# Patient Record
Sex: Female | Born: 1960 | Race: White | Hispanic: No | Marital: Married | Smoking: Current some day smoker
Health system: Southern US, Community
[De-identification: ages and names within clinical notes are randomized; demographics above are authoritative.]

## PROBLEM LIST (undated history)

## (undated) DIAGNOSIS — I4891 Unspecified atrial fibrillation: Secondary | ICD-10-CM

## (undated) DIAGNOSIS — F419 Anxiety disorder, unspecified: Secondary | ICD-10-CM

## (undated) DIAGNOSIS — I428 Other cardiomyopathies: Secondary | ICD-10-CM

## (undated) DIAGNOSIS — E782 Mixed hyperlipidemia: Secondary | ICD-10-CM

## (undated) DIAGNOSIS — E7800 Pure hypercholesterolemia, unspecified: Secondary | ICD-10-CM

## (undated) DIAGNOSIS — J302 Other seasonal allergic rhinitis: Secondary | ICD-10-CM

## (undated) DIAGNOSIS — G4733 Obstructive sleep apnea (adult) (pediatric): Secondary | ICD-10-CM

## (undated) DIAGNOSIS — I10 Essential (primary) hypertension: Secondary | ICD-10-CM

## (undated) HISTORY — DX: Obstructive sleep apnea (adult) (pediatric): G47.33

## (undated) HISTORY — DX: Other cardiomyopathies: I42.8

## (undated) HISTORY — DX: Mixed hyperlipidemia: E78.2

## (undated) HISTORY — DX: Anxiety disorder, unspecified: F41.9

## (undated) HISTORY — DX: Essential (primary) hypertension: I10

## (undated) HISTORY — DX: Unspecified atrial fibrillation: I48.91

## (undated) HISTORY — PX: CHOLECYSTECTOMY: SHX55

---

## 2001-07-15 ENCOUNTER — Emergency Department (HOSPITAL_COMMUNITY): Admission: EM | Admit: 2001-07-15 | Discharge: 2001-07-16 | Payer: Self-pay | Admitting: Emergency Medicine

## 2001-07-16 ENCOUNTER — Ambulatory Visit (HOSPITAL_COMMUNITY): Admission: AD | Admit: 2001-07-16 | Discharge: 2001-07-16 | Payer: Self-pay | Admitting: Obstetrics and Gynecology

## 2001-07-16 ENCOUNTER — Encounter: Payer: Self-pay | Admitting: Obstetrics and Gynecology

## 2001-09-19 ENCOUNTER — Other Ambulatory Visit: Admission: RE | Admit: 2001-09-19 | Discharge: 2001-09-19 | Payer: Self-pay | Admitting: Obstetrics and Gynecology

## 2003-03-20 ENCOUNTER — Other Ambulatory Visit: Admission: RE | Admit: 2003-03-20 | Discharge: 2003-03-20 | Payer: Self-pay | Admitting: Obstetrics and Gynecology

## 2004-05-28 ENCOUNTER — Inpatient Hospital Stay (HOSPITAL_COMMUNITY): Admission: AD | Admit: 2004-05-28 | Discharge: 2004-05-28 | Payer: Self-pay | Admitting: Obstetrics and Gynecology

## 2011-08-10 ENCOUNTER — Emergency Department (HOSPITAL_COMMUNITY)
Admission: EM | Admit: 2011-08-10 | Discharge: 2011-08-10 | Disposition: A | Discharge status: Home / Self Care | Payer: Managed Care, Other (non HMO) | Attending: Emergency Medicine | Admitting: Emergency Medicine

## 2011-08-10 ENCOUNTER — Encounter: Payer: Self-pay | Admitting: Oncology

## 2011-08-10 ENCOUNTER — Emergency Department (HOSPITAL_COMMUNITY): Payer: Managed Care, Other (non HMO)

## 2011-08-10 DIAGNOSIS — R071 Chest pain on breathing: Secondary | ICD-10-CM | POA: Insufficient documentation

## 2011-08-10 DIAGNOSIS — R0781 Pleurodynia: Secondary | ICD-10-CM

## 2011-08-10 DIAGNOSIS — F17200 Nicotine dependence, unspecified, uncomplicated: Secondary | ICD-10-CM | POA: Insufficient documentation

## 2011-08-10 MED ORDER — OXYCODONE-ACETAMINOPHEN 5-325 MG PO TABS: 1.0000 | ORAL_TABLET | ORAL | Status: AC | PRN

## 2011-08-10 MED ORDER — KETOROLAC TROMETHAMINE 60 MG/2ML IM SOLN
60.0000 mg | Freq: Once | INTRAMUSCULAR | Status: AC
  Administered 2011-08-10: 60 mg via INTRAMUSCULAR
  Filled 2011-08-10: qty 2

## 2011-08-10 NOTE — Discharge Instructions (Signed)
Chest Pain (Nonspecific) It is often hard to give a specific diagnosis for the cause of chest pain. There is always a chance that your pain could be related to something serious, such as a heart attack or a blood clot in the lungs. You need to follow up with your caregiver for further evaluation. CAUSES   Heartburn.   Pneumonia or bronchitis.   Anxiety and stress.   Inflammation around your heart (pericarditis) or lung (pleuritis or pleurisy).   A blood clot in the lung.   A collapsed lung (pneumothorax). It can develop suddenly on its own (spontaneous pneumothorax) or from injury (trauma) to the chest.  The chest wall is composed of bones, muscles, and cartilage. Any of these can be the source of the pain.  The bones can be bruised by injury.   The muscles or cartilage can be strained by coughing or overwork.   The cartilage can be affected by inflammation and become sore (costochondritis).  DIAGNOSIS  Lab tests or other studies, such as X-rays, an EKG, stress testing, or cardiac imaging, may be needed to find the cause of your pain.  TREATMENT   Treatment depends on what may be causing your chest pain. Treatment may include:   Acid blockers for heartburn.   Anti-inflammatory medicine.   Pain medicine for inflammatory conditions.   Antibiotics if an infection is present.   You may be advised to change lifestyle habits. This includes stopping smoking and avoiding caffeine and chocolate.   You may be advised to keep your head raised (elevated) when sleeping. This reduces the chance of acid going backward from your stomach into your esophagus.   Most of the time, nonspecific chest pain will improve within 2 to 3 days with rest and mild pain medicine.  HOME CARE INSTRUCTIONS   If antibiotics were prescribed, take the full amount even if you start to feel better.   For the next few days, avoid physical activities that bring on chest pain. Continue physical activities as  directed.   Do not smoke cigarettes or drink alcohol until your symptoms are gone.   Only take over-the-counter or prescription medicine for pain, discomfort, or fever as directed by your caregiver.   Follow your caregiver's suggestions for further testing if your chest pain does not go away.   Keep any follow-up appointments you made. If you do not go to an appointment, you could develop lasting (chronic) problems with pain. If there is any problem keeping an appointment, you must call to reschedule.  SEEK MEDICAL CARE IF:   You think you are having problems from the medicine you are taking. Read your medicine instructions carefully.   Your chest pain does not go away, even after treatment.   You develop a rash with blisters on your chest.  SEEK IMMEDIATE MEDICAL CARE IF:   You have increased chest pain or pain that spreads to your arm, neck, jaw, back, or belly (abdomen).   You develop shortness of breath, an increasing cough, or you are coughing up blood.   You have severe back or abdominal pain, feel sick to your stomach (nauseous) or throw up (vomit).   You develop severe weakness, fainting, or chills.   You have an oral temperature above 102 F (38.9 C), not controlled by medicine.  THIS IS AN EMERGENCY. Do not wait to see if the pain will go away. Get medical help at once. Call your local emergency services (911 in [place] Do not drive yourself to   the hospital. MAKE SURE YOU:   Understand these instructions.   Will watch your condition.   Will get help right away if you are not doing well or get worse.  Document Released: 05/04/2005 Document Revised: 04/06/2011 Document Reviewed: 02/28/2008 [employer] Patient Information 2012 [employer]. 

## 2011-08-10 NOTE — ED Notes (Signed)
Pt reports worsening cough x 5 days - coughing episode this morning and felt a "pop" in her left rib cage.  In ER d/t pain.  Took 800mg  Ibuprofen @ 0300 this morning w/o relief of symptoms.

## 2011-08-10 NOTE — ED Provider Notes (Signed)
History     CSN: [id number]  Arrival date & time 08/10/11  4098   First MD Initiated Contact with Patient 08/10/11 [phone]      Chief Complaint  Patient presents with  . Rib pain     (left) rib    (Consider location/radiation/quality/duration/timing/severity/associated sxs/prior treatment) HPI Comments: Patient c/o pain to her left lateral ribs.  States she was at work this morning and sneezed, then felt a sharp pain to her ribs and also felt a "pop" .  Now has pain to same ara with movement, cough, deep breath.  She denies abd pain, fall, dyspnea, or sternal chest pain  Patient is a 51 y.o. female presenting with chest pain. The history is provided by the patient.  Chest Pain The chest pain began 1 - 2 hours ago. Chest pain occurs constantly. The chest pain is unchanged. The pain is associated with breathing, lifting and exertion (sneezing). The severity of the pain is moderate. The quality of the pain is described as aching and sharp. The pain does not radiate. Chest pain is worsened by certain positions, deep breathing and exertion. Pertinent negatives for primary symptoms include no fever, no syncope, no shortness of breath, no cough, no wheezing, no palpitations, no abdominal pain, no vomiting, no dizziness and no altered mental status.  Pertinent negatives for associated symptoms include no lower extremity edema, no near-syncope, no numbness, no orthopnea and no weakness. She tried nothing for the symptoms. Risk factors include smoking/tobacco exposure.     History reviewed. No pertinent past medical history.  Past Surgical History  Procedure Date  . Cholecystectomy     History reviewed. No pertinent family history.  History  Substance Use Topics  . Smoking status: Current Everyday Smoker -- 0.5 packs/day    Types: Cigarettes  . Smokeless tobacco: Not on file  . Alcohol Use: No    OB History    Grav Para Term Preterm Abortions TAB SAB Ect Mult Living                   Review of Systems  Constitutional: Negative for fever.  HENT: Negative for neck stiffness and ear discharge.   Respiratory: Negative for cough, chest tightness, shortness of breath and wheezing.   Cardiovascular: Positive for chest pain. Negative for palpitations, orthopnea, leg swelling, syncope and near-syncope.  Gastrointestinal: Negative for vomiting and abdominal pain.  Skin: Negative.   Neurological: Negative for dizziness, weakness, numbness and headaches.  Psychiatric/Behavioral: Negative for altered mental status.  All other systems reviewed and are negative.    Allergies  Review of patient's allergies indicates no known allergies.  Home Medications  No current outpatient prescriptions on file.  BP 132/100  Pulse 85  Temp(Src) 97.5 F (36.4 C) (Oral)  Resp 18  Ht 5\' 9"  (1.753 m)  Wt 235 lb (106.595 kg)  BMI 34.70 kg/m2  SpO2 98%  LMP 08/05/2011  Physical Exam  Nursing note and vitals reviewed. Constitutional: She is oriented to person, place, and time. She appears well-developed and well-nourished.       Uncomfortable appearing  HENT:  Head: Normocephalic and atraumatic.  Mouth/Throat: Oropharynx is clear and moist.  Eyes: EOM are normal. Pupils are equal, round, and reactive to light.  Neck: Normal range of motion. Neck supple. No spinous process tenderness and no muscular tenderness present.  Cardiovascular: Normal rate, regular rhythm, normal heart sounds and intact distal pulses.   No murmur heard. Pulmonary/Chest: Effort normal and breath sounds normal. No  respiratory distress. She has no wheezes. She has no rales. Chest wall is not dull to percussion. She exhibits tenderness. She exhibits no mass, no bony tenderness, no laceration, no crepitus, no edema, no deformity, no swelling and no retraction.    Abdominal: Soft. Bowel sounds are normal. She exhibits no distension. There is no tenderness. There is no rebound and no guarding.  Musculoskeletal: She  exhibits no edema.  Lymphadenopathy:    She has no cervical adenopathy.  Neurological: She is alert and oriented to person, place, and time.  Skin: Skin is warm and dry.    ED Course  Procedures (including critical care time)  Labs Reviewed - No data to display Dg Ribs Unilateral W/chest Left  08/10/2011  *RADIOLOGY REPORT*  Clinical Data: Left rib pain.  Pleuritic chest pain.  LEFT RIBS AND CHEST - 3+ VIEW  Comparison: None.  Findings: No definite left rib fractures are identified.  No other bone lesions are seen.  No evidence of pneumothorax or hemothorax. Both lungs are clear.  Heart size and mediastinal contours are within normal limits.  IMPRESSION: Negative.  Original Report Authenticated By: Danae Orleans, M.D.        MDM       Vitals are stable.  No hypoxia or tachypnea.  Localized ttp of the lateral left ribs.  Possible occult fx vs chest wall pain.  Clinical suspicion for PE is low.  Patient agrees to close follow-up with her PMD or to return here if sx's worsen   Mathias Bogacki L. [place], [place] 08/10/11 [phone]

## 2011-08-12 NOTE — ED Provider Notes (Signed)
Medical screening examination/treatment/procedure(s) were performed by non-physician practitioner and as supervising physician I was immediately available for consultation/collaboration.   Shelda Jakes, MD 08/12/11 [phone]

## 2011-09-05 ENCOUNTER — Other Ambulatory Visit (HOSPITAL_COMMUNITY): Payer: Self-pay | Admitting: Internal Medicine

## 2011-09-05 DIAGNOSIS — J069 Acute upper respiratory infection, unspecified: Secondary | ICD-10-CM

## 2011-09-05 DIAGNOSIS — R109 Unspecified abdominal pain: Secondary | ICD-10-CM

## 2011-09-07 ENCOUNTER — Ambulatory Visit (HOSPITAL_COMMUNITY)
Admission: RE | Admit: 2011-09-07 | Discharge: 2011-09-07 | Disposition: A | Discharge status: Home / Self Care | Payer: Managed Care, Other (non HMO) | Source: Ambulatory Visit | Attending: Internal Medicine | Admitting: Internal Medicine

## 2011-09-07 ENCOUNTER — Encounter (HOSPITAL_COMMUNITY): Payer: Self-pay

## 2011-09-07 DIAGNOSIS — R1012 Left upper quadrant pain: Secondary | ICD-10-CM | POA: Insufficient documentation

## 2011-09-07 DIAGNOSIS — R1011 Right upper quadrant pain: Secondary | ICD-10-CM | POA: Insufficient documentation

## 2011-09-07 DIAGNOSIS — R109 Unspecified abdominal pain: Secondary | ICD-10-CM

## 2011-09-07 DIAGNOSIS — J069 Acute upper respiratory infection, unspecified: Secondary | ICD-10-CM | POA: Insufficient documentation

## 2011-09-07 MED ORDER — IOHEXOL 300 MG/ML  SOLN
100.0000 mL | Freq: Once | INTRAMUSCULAR | Status: AC | PRN
  Administered 2011-09-07: 100 mL via INTRAVENOUS

## 2011-09-07 MED ORDER — IOHEXOL 300 MG/ML  SOLN
40.0000 mL | Freq: Once | INTRAMUSCULAR | Status: AC | PRN
  Administered 2011-09-07: 40 mL via ORAL

## 2011-09-09 ENCOUNTER — Encounter (INDEPENDENT_AMBULATORY_CARE_PROVIDER_SITE_OTHER): Payer: Self-pay | Admitting: *Deleted

## 2011-09-26 ENCOUNTER — Ambulatory Visit (INDEPENDENT_AMBULATORY_CARE_PROVIDER_SITE_OTHER): Payer: Managed Care, Other (non HMO) | Admitting: Internal Medicine

## 2011-09-29 ENCOUNTER — Encounter (INDEPENDENT_AMBULATORY_CARE_PROVIDER_SITE_OTHER): Payer: Self-pay | Admitting: *Deleted

## 2011-10-13 ENCOUNTER — Other Ambulatory Visit (HOSPITAL_COMMUNITY): Payer: Self-pay | Admitting: Family Medicine

## 2011-10-13 DIAGNOSIS — Z01419 Encounter for gynecological examination (general) (routine) without abnormal findings: Secondary | ICD-10-CM

## 2011-10-13 DIAGNOSIS — Z139 Encounter for screening, unspecified: Secondary | ICD-10-CM

## 2011-10-14 ENCOUNTER — Ambulatory Visit (HOSPITAL_COMMUNITY)
Admission: RE | Admit: 2011-10-14 | Discharge: 2011-10-14 | Disposition: A | Discharge status: Home / Self Care | Payer: Managed Care, Other (non HMO) | Source: Ambulatory Visit | Attending: Family Medicine | Admitting: Family Medicine

## 2011-10-14 DIAGNOSIS — Z1231 Encounter for screening mammogram for malignant neoplasm of breast: Secondary | ICD-10-CM | POA: Insufficient documentation

## 2011-10-14 DIAGNOSIS — Z01419 Encounter for gynecological examination (general) (routine) without abnormal findings: Secondary | ICD-10-CM

## 2011-10-14 DIAGNOSIS — Z139 Encounter for screening, unspecified: Secondary | ICD-10-CM

## 2013-11-14 ENCOUNTER — Other Ambulatory Visit (HOSPITAL_COMMUNITY): Payer: Self-pay | Admitting: Family Medicine

## 2013-11-14 DIAGNOSIS — Z0000 Encounter for general adult medical examination without abnormal findings: Secondary | ICD-10-CM

## 2013-11-18 ENCOUNTER — Ambulatory Visit (HOSPITAL_COMMUNITY)
Admission: RE | Admit: 2013-11-18 | Discharge: 2013-11-18 | Disposition: A | Discharge status: Home / Self Care | Payer: Managed Care, Other (non HMO) | Source: Ambulatory Visit | Attending: Family Medicine | Admitting: Family Medicine

## 2013-11-18 DIAGNOSIS — Z1231 Encounter for screening mammogram for malignant neoplasm of breast: Secondary | ICD-10-CM | POA: Insufficient documentation

## 2013-11-18 DIAGNOSIS — Z0000 Encounter for general adult medical examination without abnormal findings: Secondary | ICD-10-CM

## 2014-03-19 ENCOUNTER — Telehealth: Payer: Self-pay

## 2014-03-19 NOTE — Telephone Encounter (Signed)
Gastroenterology Pre-Procedure Review  Request Date: Requesting Physician: Fusco  PATIENT REVIEW QUESTIONS: The patient responded to the following health history questions as indicated:    1. Diabetes Melitis: NO 2. Joint replacements in the past 12 months: NO 3. Major health problems in the past 3 months: NO 4. Has an artificial valve or MVP: NO 5. Has a defibrillator: NO 6. Has been advised in past to take antibiotics in advance of a procedure like teeth cleaning: NO 7.Alcohol: NO 8. Family History:NO    MEDICATIONS & ALLERGIES:    Patient reports the following regarding taking any blood thinners:   Plavix? NO Aspirin? YES Coumadin? NO  Patient confirms/reports the following medications:  Current Outpatient Prescriptions  Medication Sig Dispense Refill  . dextromethorphan-guaiFENesin (TUSSIN DM) 10-100 MG/5ML liquid Take 30 mLs by mouth every 4 (four) hours as needed. Cough       . ibuprofen (ADVIL,MOTRIN) 800 MG tablet Take 800 mg by mouth every 8 (eight) hours as needed. Pain        No current facility-administered medications for this visit.    Patient confirms/reports the following allergies:  No Known Allergies  No orders of the defined types were placed in this encounter.    AUTHORIZATION INFORMATION Primary Insurance: [place],  [place] #: [id number],  Group #: [id number] Pre-Cert / Josem Kaufmann required: Pre-Cert / Auth #:   SCHEDULE INFORMATION: Procedure has been scheduled as follows:  Date: , Time:  Location:   This Gastroenterology Pre-Precedure Review Form is being routed to the following provider(s):

## 2014-03-21 NOTE — Telephone Encounter (Signed)
LMOM to call to schedule the colonoscopy.  

## 2014-03-24 ENCOUNTER — Encounter (HOSPITAL_COMMUNITY): Payer: Self-pay | Admitting: Pharmacy Technician

## 2014-03-24 ENCOUNTER — Other Ambulatory Visit: Payer: Self-pay

## 2014-03-24 DIAGNOSIS — Z1211 Encounter for screening for malignant neoplasm of colon: Secondary | ICD-10-CM

## 2014-03-24 NOTE — Telephone Encounter (Signed)
PT is scheduled for 04/07/2014 at 8:30 AM with Dr. Oneida Alar for colonoscopy.

## 2014-03-25 NOTE — Telephone Encounter (Signed)
PREPOPIK-DRINK WATER TO KEEP URINE LIGHT YELLOW.  PT SHOULD DROP OFF RX 3 DAYS PRIOR TO PROCEDURE.  

## 2014-03-26 MED ORDER — SOD PICOSULFATE-MAG OX-CIT ACD 10-3.5-12 MG-GM-GM PO PACK: 1.0000 | PACK | ORAL | Status: DC

## 2014-03-26 NOTE — Telephone Encounter (Signed)
Rx sent to the pharmacy and instructions mailed to pt.  

## 2014-04-03 ENCOUNTER — Telehealth: Payer: Self-pay

## 2014-04-03 NOTE — Telephone Encounter (Signed)
PA NOT REQUIRED FOR OUTPATIENT SCREENING COLONOSCOPY.

## 2014-04-07 ENCOUNTER — Encounter (HOSPITAL_COMMUNITY)
Admission: RE | Disposition: A | Discharge status: Home / Self Care | Payer: Self-pay | Source: Ambulatory Visit | Attending: Gastroenterology

## 2014-04-07 ENCOUNTER — Encounter (HOSPITAL_COMMUNITY): Payer: Self-pay | Admitting: *Deleted

## 2014-04-07 ENCOUNTER — Ambulatory Visit (HOSPITAL_COMMUNITY)
Admission: RE | Admit: 2014-04-07 | Discharge: 2014-04-07 | Disposition: A | Discharge status: Home / Self Care | Payer: Managed Care, Other (non HMO) | Source: Ambulatory Visit | Attending: Gastroenterology | Admitting: Gastroenterology

## 2014-04-07 DIAGNOSIS — K648 Other hemorrhoids: Secondary | ICD-10-CM | POA: Diagnosis not present

## 2014-04-07 DIAGNOSIS — Z7982 Long term (current) use of aspirin: Secondary | ICD-10-CM | POA: Diagnosis not present

## 2014-04-07 DIAGNOSIS — Z1211 Encounter for screening for malignant neoplasm of colon: Secondary | ICD-10-CM | POA: Insufficient documentation

## 2014-04-07 DIAGNOSIS — Q438 Other specified congenital malformations of intestine: Secondary | ICD-10-CM | POA: Insufficient documentation

## 2014-04-07 DIAGNOSIS — D126 Benign neoplasm of colon, unspecified: Secondary | ICD-10-CM | POA: Insufficient documentation

## 2014-04-07 DIAGNOSIS — Z79899 Other long term (current) drug therapy: Secondary | ICD-10-CM | POA: Insufficient documentation

## 2014-04-07 DIAGNOSIS — Z9089 Acquired absence of other organs: Secondary | ICD-10-CM | POA: Insufficient documentation

## 2014-04-07 HISTORY — DX: Other seasonal allergic rhinitis: J30.2

## 2014-04-07 HISTORY — PX: COLONOSCOPY: SHX5424

## 2014-04-07 HISTORY — DX: Pure hypercholesterolemia, unspecified: E78.00

## 2014-04-07 SURGERY — COLONOSCOPY
Anesthesia: Moderate Sedation

## 2014-04-07 MED ORDER — STERILE WATER FOR IRRIGATION IR SOLN
Status: DC | PRN
  Administered 2014-04-07: 09:00:00

## 2014-04-07 MED ORDER — SPOT INK MARKER SYRINGE KIT
PACK | SUBMUCOSAL | Status: DC | PRN
  Administered 2014-04-07: 1 mL via SUBMUCOSAL

## 2014-04-07 MED ORDER — SODIUM CHLORIDE 0.9 % IV SOLN
INTRAVENOUS | Status: DC
  Administered 2014-04-07: 08:00:00 via INTRAVENOUS

## 2014-04-07 MED ORDER — MEPERIDINE HCL 100 MG/ML IJ SOLN
INTRAMUSCULAR | Status: AC
  Filled 2014-04-07: qty 2

## 2014-04-07 MED ORDER — MIDAZOLAM HCL 5 MG/5ML IJ SOLN
INTRAMUSCULAR | Status: DC | PRN
  Administered 2014-04-07 (×3): 2 mg via INTRAVENOUS

## 2014-04-07 MED ORDER — MIDAZOLAM HCL 5 MG/5ML IJ SOLN
INTRAMUSCULAR | Status: AC
  Filled 2014-04-07: qty 10

## 2014-04-07 MED ORDER — MEPERIDINE HCL 100 MG/ML IJ SOLN
INTRAMUSCULAR | Status: DC | PRN
  Administered 2014-04-07 (×3): 25 mg via INTRAVENOUS

## 2014-04-07 NOTE — Discharge Instructions (Signed)
You had 2 polyps removed. You have internal hemorrhoids and diverticulosis IN YOUR RIGHT AND LEFT COLON.   FOLLOW A HIGH FIBER DIET. AVOID ITEMS THAT CAUSE BLOATING. SEE INFO BELOW.  YOUR BIOPSY RESULTS SHOULD BE BACK IN 14 DAYS.  Next colonoscopy in 1-3 years.   Colonoscopy Care After Read the instructions outlined below and refer to this sheet in the next week. These discharge instructions provide you with general information on caring for yourself after you leave the hospital. While your treatment has been planned according to the most current medical practices available, unavoidable complications occasionally occur. If you have any problems or questions after discharge, call DR. Prarthana Parlin, [phone].  ACTIVITY  You may resume your regular activity, but move at a slower pace for the next 24 hours.   Take frequent rest periods for the next 24 hours.   Walking will help get rid of the air and reduce the bloated feeling in your belly (abdomen).   No driving for 24 hours (because of the medicine (anesthesia) used during the test).   You may shower.   Do not sign any important legal documents or operate any machinery for 24 hours (because of the anesthesia used during the test).    NUTRITION  Drink plenty of fluids.   You may resume your normal diet as instructed by your doctor.   Begin with a light meal and progress to your normal diet. Heavy or fried foods are harder to digest and may make you feel sick to your stomach (nauseated).   Avoid alcoholic beverages for 24 hours or as instructed.    MEDICATIONS  You may resume your normal medications.   WHAT YOU CAN EXPECT TODAY  Some feelings of bloating in the abdomen.   Passage of more gas than usual.   Spotting of blood in your stool or on the toilet paper  .  IF YOU HAD POLYPS REMOVED DURING THE COLONOSCOPY:  Eat a soft diet IF YOU HAVE NAUSEA, BLOATING, ABDOMINAL PAIN, OR VOMITING.    FINDING OUT THE RESULTS  OF YOUR TEST Not all test results are available during your visit. DR. Oneida Alar WILL CALL YOU WITHIN 7 DAYS OF YOUR PROCEDUE WITH YOUR RESULTS. Do not assume everything is normal if you have not heard from DR. Mertie Haslem IN ONE WEEK, CALL HER OFFICE AT [phone].  SEEK IMMEDIATE MEDICAL ATTENTION AND CALL THE OFFICE: [phone] IF:  You have more than a spotting of blood in your stool.   Your belly is swollen (abdominal distention).   You are nauseated or vomiting.   You have a temperature over 101F.   You have abdominal pain or discomfort that is severe or gets worse throughout the day.  Polyps, Colon  A polyp is extra tissue that grows inside your body. Colon polyps grow in the large intestine. The large intestine, also called the colon, is part of your digestive system. It is a long, hollow tube at the end of your digestive tract where your body makes and stores stool. Most polyps are not dangerous. They are benign. This means they are not cancerous. But over time, some types of polyps can turn into cancer. Polyps that are smaller than a pea are usually not harmful. But larger polyps could someday become or may already be cancerous. To be safe, doctors remove all polyps and test them.   WHO GETS POLYPS? Anyone can get polyps, but certain people are more likely than others. You may have a greater chance of  getting polyps if:  You are over 50.   You have had polyps before.   Someone in your family has had polyps.   Someone in your family has had cancer of the large intestine.   Find out if someone in your family has had polyps. You may also be more likely to get polyps if you:   Eat a lot of fatty foods   Smoke   Drink alcohol   Do not exercise  Eat too much   TREATMENT  The caregiver will remove the polyp during sigmoidoscopy or colonoscopy.  PREVENTION There is not one sure way to prevent polyps. You might be able to lower your risk of getting them if you:  Eat more  fruits and vegetables and less fatty food.   Do not smoke.   Avoid alcohol.   Exercise every day.   Lose weight if you are overweight.   Eating more calcium and folate can also lower your risk of getting polyps. Some foods that are rich in calcium are milk, cheese, and broccoli. Some foods that are rich in folate are chickpeas, kidney beans, and spinach.   High-Fiber Diet A high-fiber diet changes your normal diet to include more whole grains, legumes, fruits, and vegetables. Changes in the diet involve replacing refined carbohydrates with unrefined foods. The calorie level of the diet is essentially unchanged. The Dietary Reference Intake (recommended amount) for adult males is 38 grams per day. For adult females, it is 25 grams per day. Pregnant and lactating women should consume 28 grams of fiber per day. Fiber is the intact part of a plant that is not broken down during digestion. Functional fiber is fiber that has been isolated from the plant to provide a beneficial effect in the body. PURPOSE  Increase stool bulk.   Ease and regulate bowel movements.   Lower cholesterol.  INDICATIONS THAT YOU NEED MORE FIBER  Constipation and hemorrhoids.   Uncomplicated diverticulosis (intestine condition) and irritable bowel syndrome.   Weight management.   As a protective measure against hardening of the arteries (atherosclerosis), diabetes, and cancer.   GUIDELINES FOR INCREASING FIBER IN THE DIET  Start adding fiber to the diet slowly. A gradual increase of about 5 more grams (2 slices of whole-wheat bread, 2 servings of most fruits or vegetables, or 1 bowl of high-fiber cereal) per day is best. Too rapid an increase in fiber may result in constipation, flatulence, and bloating.   Drink enough water and fluids to keep your urine clear or pale yellow. Water, juice, or caffeine-free drinks are recommended. Not drinking enough fluid may cause constipation.   Eat a variety of high-fiber  foods rather than one type of fiber.   Try to increase your intake of fiber through using high-fiber foods rather than fiber pills or supplements that contain small amounts of fiber.   The goal is to change the types of food eaten. Do not supplement your present diet with high-fiber foods, but replace foods in your present diet.  INCLUDE A VARIETY OF FIBER SOURCES  Replace refined and processed grains with whole grains, canned fruits with fresh fruits, and incorporate other fiber sources. White rice, white breads, and most bakery goods contain little or no fiber.   Brown whole-grain rice, buckwheat oats, and many fruits and vegetables are all good sources of fiber. These include: broccoli, Brussels sprouts, cabbage, cauliflower, beets, sweet potatoes, white potatoes (skin on), carrots, tomatoes, eggplant, squash, berries, fresh fruits, and dried fruits.  Cereals appear to be the richest source of fiber. Cereal fiber is found in whole grains and bran. Bran is the fiber-rich outer coat of cereal grain, which is largely removed in refining. In whole-grain cereals, the bran remains. In breakfast cereals, the largest amount of fiber is found in those with "bran" in their names. The fiber content is sometimes indicated on the label.   You may need to include additional fruits and vegetables each day.   In baking, for 1 cup white flour, you may use the following substitutions:   1 cup whole-wheat flour minus 2 tablespoons.   1/2 cup white flour plus 1/2 cup whole-wheat flour.   Diverticulosis Diverticulosis is a common condition that develops when small pouches (diverticula) form in the wall of the colon. The risk of diverticulosis increases with age. It happens more often in people who eat a low-fiber diet. Most individuals with diverticulosis have no symptoms. Those individuals with symptoms usually experience belly (abdominal) pain, constipation, or loose stools (diarrhea).  HOME CARE  INSTRUCTIONS  Increase the amount of fiber in your diet as directed by your caregiver or dietician. This may reduce symptoms of diverticulosis.   Drink at least 6 to 8 glasses of water each day to prevent constipation.   Try not to strain when you have a bowel movement.   Avoiding nuts and seeds to prevent complications is still an uncertain benefit.       FOODS HAVING HIGH FIBER CONTENT INCLUDE:  Fruits. Apple, peach, pear, tangerine, raisins, prunes.   Vegetables. Brussels sprouts, asparagus, broccoli, cabbage, carrot, cauliflower, romaine lettuce, spinach, summer squash, tomato, winter squash, zucchini.   Starchy Vegetables. Baked beans, kidney beans, lima beans, split peas, lentils, potatoes (with skin).   Grains. Whole wheat bread, brown rice, bran flake cereal, plain oatmeal, white rice, shredded wheat, bran muffins.    SEEK IMMEDIATE MEDICAL CARE IF:  You develop increasing pain or severe bloating.   You have an oral temperature above 101F.   You develop vomiting or bowel movements that are bloody or black.   Hemorrhoids Hemorrhoids are dilated (enlarged) veins around the rectum. Sometimes clots will form in the veins. This makes them swollen and painful. These are called thrombosed hemorrhoids. Causes of hemorrhoids include:  Constipation.   Straining to have a bowel movement.   HEAVY LIFTING HOME CARE INSTRUCTIONS  Eat a well balanced diet and drink 6 to 8 glasses of water every day to avoid constipation. You may also use a bulk laxative.   Avoid straining to have bowel movements.   Keep anal area dry and clean.   Do not use a donut shaped pillow or sit on the toilet for long periods. This increases blood pooling and pain.   Move your bowels when your body has the urge; this will require less straining and will decrease pain and pressure.

## 2014-04-07 NOTE — Op Note (Signed)
[facility] [address] [place], [address]   COLONOSCOPY PROCEDURE REPORT  PATIENT: Jade, Bradley  MR#: [id number] BIRTHDATE: 1961-05-17 , 53  yrs. old GENDER: Female ENDOSCOPIST: Barney Drain, MD REFERRED JF:HLKT Hilma Favors, M.D. PROCEDURE DATE:  04/07/2014 PROCEDURE:   Colonoscopy with snare polypectomy and Submucosal injection, any substance INDICATIONS:Average risk patient for colon cancer. MEDICATIONS: Demerol 75 mg IV and Versed 6 mg IV  DESCRIPTION OF PROCEDURE:    Physical exam was performed.  Informed consent was obtained from the patient after explaining the benefits, risks, and alternatives to procedure.  The patient was connected to monitor and placed in left lateral position. Continuous oxygen was provided by nasal cannula and IV medicine administered through an indwelling cannula.  After administration of sedation and rectal exam, the patients rectum was intubated and the EC-3890Li ([id number]) and EC-3890Li ([id number])  colonoscope was advanced under direct visualization to the ileum.  The scope was removed slowly by carefully examining the color, texture, anatomy, and integrity mucosa on the way out.  The patient was recovered in endoscopy and discharged home in satisfactory condition.    COLON FINDINGS: There was moderate diverticulosis noted throughout the entire examined colon with associated muscular hypertrophy and tortuosity.  , Two sessile polyps measuring 6-8 mm in size were found at the hepatic flexure and in the sigmoid colon.  A polypectomy was performed using snare cautery. 1 CC SPOT INJECTED AT HEPATIC FLEXURE , The left colon is redundant.  Manual abdominal counter-pressure was used to reach the cecum, and Moderate sized internal hemorrhoids were found.  PREP QUALITY: good.  CECAL W/D TIME: 28 minutes     COMPLICATIONS: None  ENDOSCOPIC IMPRESSION: 1.   Moderate diverticulosis throughout the entire examined colon 2.   Two COLON  polyps REMOVED 3.   The LEFT colon IS redundant 4.   Moderate sized internal hemorrhoids   RECOMMENDATIONS: FOLLOW A HIGH FIBER DIET.  AVOID ITEMS THAT CAUSE BLOATING. BIOPSY RESULTS SHOULD BE BACK IN 14 DAYS.  Next colonoscopy WITH AN OVERTUBEin 1 year if advanced, 3 years if serrated, and 5 years if simple adenomas.       _______________________________ Lorrin MaisBarney Drain, MD 04/07/2014 4:03 PM

## 2014-04-07 NOTE — H&P (Signed)
  Primary Care Physician:  Purvis Kilts, MD Primary Gastroenterologist:  Dr. Oneida Alar  Pre-Procedure History & Physical: HPI:  Jade Bradley is a 53 y.o. female here for [place].  Past Medical History  Diagnosis Date  . Seasonal allergies   . Hypercholesteremia     Past Surgical History  Procedure Laterality Date  . Cholecystectomy      Prior to Admission medications   Medication Sig Start Date End Date Taking? Authorizing Provider  amLODipine (NORVASC) 5 MG tablet Take 5 mg by mouth daily.   Yes Historical Provider, MD  aspirin 325 MG tablet Take 325 mg by mouth daily.   Yes Historical Provider, MD  Omega-3 Fatty Acids (FISH OIL) 1000 MG CAPS Take 1,000 mg by mouth 2 (two) times daily.   Yes Historical Provider, MD  pseudoephedrine (SUDAFED) 120 MG 12 hr tablet Take 120 mg by mouth every 12 (twelve) hours as needed for congestion.   Yes Historical Provider, MD  Sod Picosulfate-Mag Ox-Cit Acd 10-3.5-12 MG-GM-GM PACK Take 1 Container by mouth as directed. 03/26/14  Yes Danie Binder, MD  dextromethorphan-guaiFENesin (TUSSIN DM) 10-100 MG/5ML liquid Take 30 mLs by mouth every 4 (four) hours as needed. Cough     Historical Provider, MD    Allergies as of 03/24/2014  . (No Known Allergies)    Family History  Problem Relation Age of Onset  . Colon cancer Neg Hx     History   Social History  . Marital Status: Divorced    Spouse Name: N/A    Number of Children: N/A  . Years of Education: N/A   Occupational History  . Not on file.   Social History Main Topics  . Smoking status: Current Every Day Smoker -- 0.50 packs/day for 48 years    Types: Cigarettes  . Smokeless tobacco: Not on file  . Alcohol Use: No  . Drug Use: No  . Sexual Activity: Not on file   Other Topics Concern  . Not on file   Social History Narrative  . No narrative on file    Review of Systems: See HPI, otherwise negative ROS   Physical Exam: BP 137/78  Pulse 70   Temp(Src) 97.7 F (36.5 C) (Oral)  Resp 14  Ht 5\' 8"  (1.727 m)  Wt 220 lb (99.791 kg)  BMI 33.46 kg/m2  SpO2 100%  LMP 08/11/2011 General:   Alert,  pleasant and cooperative in NAD Head:  Normocephalic and atraumatic. Neck:  Supple; Lungs:  Clear throughout to auscultation.    Heart:  Regular rate and rhythm. Abdomen:  Soft, nontender and nondistended. Normal bowel sounds, without guarding, and without rebound.   Neurologic:  Alert and  oriented x4;  grossly normal neurologically.  Impression/Plan:     SCREENING  Plan:  1. TCS TODAY

## 2014-04-17 ENCOUNTER — Telehealth: Payer: Self-pay | Admitting: Gastroenterology

## 2014-04-17 NOTE — Telephone Encounter (Signed)
Reminder in epic °

## 2014-04-17 NOTE — Telephone Encounter (Signed)
Called and informed pt.  

## 2014-04-17 NOTE — Telephone Encounter (Signed)
Please call pt. She had HYPERPLASTIC POLYPS removed.  FOLLOW A HIGH FIBER DIET. AVOID ITEMS THAT CAUSE BLOATING.  Next colonoscopy in 10 years. 

## 2015-06-16 ENCOUNTER — Other Ambulatory Visit (HOSPITAL_COMMUNITY): Payer: Self-pay | Admitting: Family Medicine

## 2015-06-16 DIAGNOSIS — Z1231 Encounter for screening mammogram for malignant neoplasm of breast: Secondary | ICD-10-CM

## 2015-06-22 ENCOUNTER — Ambulatory Visit (HOSPITAL_COMMUNITY)
Admission: RE | Admit: 2015-06-22 | Discharge: 2015-06-22 | Disposition: A | Discharge status: Home / Self Care | Payer: Managed Care, Other (non HMO) | Source: Ambulatory Visit | Attending: Family Medicine | Admitting: Family Medicine

## 2015-06-22 DIAGNOSIS — Z1231 Encounter for screening mammogram for malignant neoplasm of breast: Secondary | ICD-10-CM | POA: Insufficient documentation

## 2016-05-24 ENCOUNTER — Other Ambulatory Visit (HOSPITAL_COMMUNITY): Payer: Self-pay | Admitting: Family Medicine

## 2016-05-24 DIAGNOSIS — Z1231 Encounter for screening mammogram for malignant neoplasm of breast: Secondary | ICD-10-CM

## 2016-06-24 ENCOUNTER — Ambulatory Visit (HOSPITAL_COMMUNITY)
Admission: RE | Admit: 2016-06-24 | Discharge: 2016-06-24 | Disposition: A | Discharge status: Home / Self Care | Payer: Managed Care, Other (non HMO) | Source: Ambulatory Visit | Attending: Family Medicine | Admitting: Family Medicine

## 2016-06-24 DIAGNOSIS — Z1231 Encounter for screening mammogram for malignant neoplasm of breast: Secondary | ICD-10-CM | POA: Diagnosis present

## 2019-09-03 ENCOUNTER — Other Ambulatory Visit (HOSPITAL_COMMUNITY): Payer: Self-pay | Admitting: Family Medicine

## 2019-09-03 DIAGNOSIS — Z1231 Encounter for screening mammogram for malignant neoplasm of breast: Secondary | ICD-10-CM

## 2019-09-27 ENCOUNTER — Ambulatory Visit (HOSPITAL_COMMUNITY): Payer: Managed Care, Other (non HMO)

## 2019-10-04 ENCOUNTER — Ambulatory Visit (HOSPITAL_COMMUNITY)
Admission: RE | Admit: 2019-10-04 | Discharge: 2019-10-04 | Disposition: A | Discharge status: Home / Self Care | Payer: Managed Care, Other (non HMO) | Source: Ambulatory Visit | Attending: Family Medicine | Admitting: Family Medicine

## 2019-10-04 ENCOUNTER — Other Ambulatory Visit: Payer: Self-pay

## 2019-10-04 DIAGNOSIS — Z1231 Encounter for screening mammogram for malignant neoplasm of breast: Secondary | ICD-10-CM | POA: Diagnosis present

## 2019-10-30 ENCOUNTER — Encounter: Payer: Self-pay | Admitting: *Deleted

## 2019-10-31 ENCOUNTER — Ambulatory Visit (INDEPENDENT_AMBULATORY_CARE_PROVIDER_SITE_OTHER): Payer: Managed Care, Other (non HMO) | Admitting: Cardiovascular Disease

## 2019-10-31 ENCOUNTER — Telehealth: Payer: Self-pay | Admitting: *Deleted

## 2019-10-31 ENCOUNTER — Encounter: Payer: Self-pay | Admitting: Cardiovascular Disease

## 2019-10-31 ENCOUNTER — Other Ambulatory Visit: Payer: Self-pay

## 2019-10-31 VITALS — BP 120/86 | HR 104 | Ht 67.0 in | Wt 218.8 lb

## 2019-10-31 DIAGNOSIS — I10 Essential (primary) hypertension: Secondary | ICD-10-CM

## 2019-10-31 DIAGNOSIS — G4730 Sleep apnea, unspecified: Secondary | ICD-10-CM

## 2019-10-31 DIAGNOSIS — I4891 Unspecified atrial fibrillation: Secondary | ICD-10-CM

## 2019-10-31 MED ORDER — RIVAROXABAN 20 MG PO TABS: 20.0000 mg | ORAL_TABLET | Freq: Every day | ORAL | 6 refills | Status: DC

## 2019-10-31 MED ORDER — METOPROLOL SUCCINATE ER 50 MG PO TB24: 50.0000 mg | ORAL_TABLET | Freq: Every day | ORAL | 6 refills | Status: DC

## 2019-10-31 NOTE — Telephone Encounter (Signed)
Fax received from [facility] not covered on patient's insurance.    Eliquis is covered - would this medication be appropriate for her ?

## 2019-10-31 NOTE — Progress Notes (Signed)
CARDIOLOGY CONSULT NOTE  Patient ID: KEARIE PROPER MRN: [id number] DOB/AGE: Apr 25, 1961 59 y.o.  Admit date: (Not on file) Primary Physician: Sharilyn Sites, MD  Reason for Consultation: New onset atrial fibrillation  HPI: Jade Bradley is a 59 y.o. female who is being seen today for the evaluation of new onset atrial fibrillation at the request of Jake Samples, [place]*.   I personally reviewed the ECG performed today which shows rapid atrial fibrillation with PVCs, heart rate 111 bpm.  I reviewed PCP notes.  She was found to be tachycardic on physical exam with an irregular rhythm on 10/10/2019.  An ECG was reportedly obtained which is not available to me at present.  She appeared to be in rapid atrial fibrillation.  She was started on Toprol XL and Xarelto.  She is here with her husband.  They were married in January 2020.  She had been experiencing palpitations for at least a month prior to presenting to her PCP.  She has had episodic chest pressure both at rest and occasion with exertion.  She has leg swelling when she has been on her feet for long periods of time.  She has occasional dizziness but denies syncope.  She denies bleeding issues since being on Xarelto.  She stopped taking aspirin when she started Xarelto.  She has periodic headaches.  Her husband confirms that she snores a lot and the patient said her son can hear her snoring from a different room as well.  There have been no witnessed apneic episodes.  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  Her husband says that this has created a lot of stress for the patient.  He said she minimizes it at times.   No Known Allergies  Current Outpatient Medications  Medication Sig Dispense Refill  . amLODipine-valsartan (EXFORGE) 10-160 MG tablet Take 1 tablet by mouth daily.    Marland Kitchen aspirin EC 81 MG tablet Take 81 mg by mouth daily.    . metoprolol  succinate (TOPROL-XL) 25 MG 24 hr tablet Take 25 mg by mouth daily.    . pravastatin (PRAVACHOL) 20 MG tablet Take 20 mg by mouth daily.    . rivaroxaban (XARELTO) 20 MG TABS tablet Take 20 mg by mouth daily with supper.     No current facility-administered medications for this visit.    Past Medical History:  Diagnosis Date  . Hypercholesteremia   . Seasonal allergies     Past Surgical History:  Procedure Laterality Date  . CHOLECYSTECTOMY    . COLONOSCOPY N/A 04/07/2014   Procedure: COLONOSCOPY;  Surgeon: Danie Binder, MD;  [facility];  Service: Endoscopy;  Laterality: N/A;  8:30 AM    Social History   Socioeconomic History  . Marital status: Divorced    Spouse name: Not on file  . Number of children: Not on file  . Years of education: Not on file  . Highest education level: Not on file  Occupational History  . Not on file  Tobacco Use  . Smoking status: Current Every Day Smoker    Packs/day: 0.50    Years: 48.00    Pack years: 24.00    Types: Cigarettes    Start date: 03/17/1975  . Smokeless tobacco: Never Used  Substance and Sexual Activity  . Alcohol use: No  . Drug use: No  . Sexual activity: Not on file  Other Topics  Concern  . Not on file  Social History Narrative  . Not on file   Social Determinants of Health   Financial Resource Strain:   . Difficulty of Paying Living Expenses:   Food Insecurity:   . Worried About Charity fundraiser in the Last Year:   . Arboriculturist in the Last Year:   Transportation Needs:   . Film/video editor (Medical):   Marland Kitchen Lack of Transportation (Non-Medical):   Physical Activity:   . Days of Exercise per Week:   . Minutes of Exercise per Session:   Stress:   . Feeling of Stress :   Social Connections:   . Frequency of Communication with Friends and Family:   . Frequency of Social Gatherings with Friends and Family:   . Attends Religious Services:   . Active Member of Clubs or Organizations:   .  Attends Archivist Meetings:   Marland Kitchen Marital Status:   Intimate Partner Violence:   . Fear of Current or Ex-Partner:   . Emotionally Abused:   Marland Kitchen Physically Abused:   . Sexually Abused:      No family history of premature CAD in 1st degree relatives.  No outpatient medications have been marked as taking for the 10/31/19 encounter (Office Visit) with Herminio Commons, MD.      Review of systems complete and found to be negative unless listed above in HPI   [facility], LPN was present throughout the entirety of the encounter.  Physical exam Blood pressure 120/86, pulse (!) 104, height 5\' 7"  (1.702 m), weight 218 lb 12.8 oz (99.2 kg), last menstrual period 08/11/2011, SpO2 98 %. General: NAD Neck: No JVD, no thyromegaly or thyroid nodule.  Lungs: Clear to auscultation bilaterally with normal respiratory effort. CV: Nondisplaced PMI.  Tachycardic, irregular rhythm, normal S1/S2, no S3, no murmur.  No peripheral edema.  No carotid bruit.    Abdomen: Soft, nontender, no distention.  Skin: Intact without lesions or rashes.  Neurologic: Alert and oriented x 3.  Psych: Normal affect. Extremities: No clubbing or cyanosis.  HEENT: Normal.   ECG: Most recent ECG reviewed.   Labs: No results found for: K, BUN, CREATININE, ALT, TSH, HGB   Lipids: No results found for: LDLCALC, LDLDIRECT, CHOL, TRIG, HDL      ASSESSMENT AND PLAN:   1.  New onset rapid atrial fibrillation: Heart rate is suboptimally controlled on Toprol-XL 25 mg daily.  She is periodically symptomatic with palpitations.  I will increase Toprol-XL to 50 mg daily.  I will obtain an echocardiogram to evaluate cardiac structure, function, and left atrial size in particular.  If she remains symptomatic in spite of adequate heart rate control, I will proceed with direct-current cardioversion.  We discussed this in great detail.  She is anticoagulated with Xarelto and has not missed any doses.  2.  Hypertension:  Blood pressure is controlled on present therapy.  This will need further monitoring given the increase in dosage of Toprol-XL.  3.  Sleep disordered breathing: She is obese and has hypertension and new onset atrial fibrillation.  She has a long history of snoring and has periodic headaches.  She is at high risk for obstructive sleep apnea.  I will make a referral for a sleep study.    Disposition: Follow up in 3 months  Signed: Kate Sable, M.D., F.A.C.C.  10/31/2019, 10:41 AM

## 2019-10-31 NOTE — Patient Instructions (Addendum)
Medication Instructions:   Increase Toprol XL to 50mg  daily.    Continue all other medications.    Labwork:   Testing/Procedures:  Your physician has requested that you have an echocardiogram. Echocardiography is a painless test that uses sound waves to create images of your heart. It provides your doctor with information about the size and shape of your heart and how well your heart's chambers and valves are working. This procedure takes approximately one hour. There are no restrictions for this procedure.  Office will contact with results via phone or letter.    Follow-Up: 3 months - office   Any Other Special Instructions Will Be Listed Below (If Applicable). You have been referred to:  [facility]  If you need a refill on your cardiac medications before your next appointment, please call your pharmacy.

## 2019-10-31 NOTE — Telephone Encounter (Signed)
Yes, can start 5 mg bid.

## 2019-11-01 ENCOUNTER — Encounter: Payer: Self-pay | Admitting: *Deleted

## 2019-11-01 MED ORDER — APIXABAN 5 MG PO TABS: 5.0000 mg | ORAL_TABLET | Freq: Two times a day (BID) | ORAL | 6 refills | Status: DC

## 2019-11-01 NOTE — Telephone Encounter (Signed)
Patient notified and verbalized understanding.  Will send new prescription to [facility] now.

## 2019-11-04 ENCOUNTER — Telehealth: Payer: Self-pay | Admitting: Cardiovascular Disease

## 2019-11-04 MED ORDER — RIVAROXABAN 20 MG PO TABS: 20.0000 mg | ORAL_TABLET | Freq: Every day | ORAL | 6 refills | Status: DC

## 2019-11-04 MED ORDER — RIVAROXABAN 20 MG PO TABS: 20.0000 mg | ORAL_TABLET | Freq: Every day | ORAL | 0 refills | Status: DC

## 2019-11-04 NOTE — Telephone Encounter (Signed)
Received notification from [facility] on 11/01/19 that eliquis is not covered but xarelto is.  Patient advised to come by office for xarelto samples and voucher to get fist 30 days. Also given voucher for $10/mth copay card.

## 2019-11-04 NOTE — Telephone Encounter (Signed)
Reports being completely out of xarelto 20 mg. Took last dose yesterday. Reports she has not picked up eliquis from the pharmacy because she is waiting for her insurance to approve it. Need 30 day free voucher for anticoagulant.

## 2019-11-04 NOTE — Telephone Encounter (Signed)
Ask for Jade Bradley this is her work number.  She is out of Xarelto

## 2019-11-08 ENCOUNTER — Telehealth: Payer: Self-pay | Admitting: *Deleted

## 2019-11-08 NOTE — Telephone Encounter (Signed)
Received prior authorization approval from Cover My Meds from [place] approved on 11/07/2019 to 11/06/2020.   Case ID [id number]

## 2019-11-19 ENCOUNTER — Encounter: Payer: Self-pay | Admitting: Neurology

## 2019-11-19 ENCOUNTER — Ambulatory Visit (INDEPENDENT_AMBULATORY_CARE_PROVIDER_SITE_OTHER): Payer: Managed Care, Other (non HMO) | Admitting: Neurology

## 2019-11-19 ENCOUNTER — Other Ambulatory Visit: Payer: Self-pay

## 2019-11-19 VITALS — BP 134/85 | HR 82 | Temp 97.0°F | Ht 67.0 in | Wt 217.5 lb

## 2019-11-19 DIAGNOSIS — F17200 Nicotine dependence, unspecified, uncomplicated: Secondary | ICD-10-CM

## 2019-11-19 DIAGNOSIS — R002 Palpitations: Secondary | ICD-10-CM | POA: Diagnosis not present

## 2019-11-19 DIAGNOSIS — R0683 Snoring: Secondary | ICD-10-CM | POA: Diagnosis not present

## 2019-11-19 DIAGNOSIS — I480 Paroxysmal atrial fibrillation: Secondary | ICD-10-CM | POA: Diagnosis not present

## 2019-11-19 NOTE — Progress Notes (Signed)
SLEEP MEDICINE CLINIC    Provider:  Larey Seat, MD  Primary Care Physician:  Sharilyn Sites, [place] [id number]     Referring Provider: Herminio Commons, [place],  [place] [address]          Chief Complaint according to patient   Patient presents with:    . New Patient (Initial Visit)            HISTORY OF PRESENT ILLNESS:  Jade Bradley is a 59  year old Caucasian female patient seen on 11/19/2019. Chief concern according to patient :  I have atrial fibrillation and Dr Bronson Ing wants me to have a sleep study, I smoke since age 35.    I have the pleasure of seeing Jade RHEAULT a right -handed  Caucasian female with a past medical history of Anxiety, Atrial fibrillation ([place]), Hypercholesteremia, and Seasonal allergies. Obesity, tobacco use,    Sleep relevant medical history:  None.     Family medical /sleep history:No other family member on CPAP with OSA, insomnia, no sleep walkers.    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  The patient currently works early shifts- since 2004.  Pets are not present. Tobacco use: since age 52. Now at 2 packs a week. ETOH use none , Caffeine intake in form of Coffee( 3-4 day) Soda( 1/ week) Tea ( seldomly ) no energy drinks. Regular exercise in form of:" on my feet all day"      Sleep habits are as follows: The patient's dinner time is between 3.30 PM. The patient goes to bed at 8.30 PM and continues to sleep for 6 hours, wakes for 1 bathroom break. Feet hurt at night- burning.  The preferred sleep position is prone , with the support of 2 pillows, flat bed.  Dreams are reportedly infrequently.  2.45 AM is the usual rise time. The patient wakes up with 2 alarms.  She reports not feeling refreshed or restored in AM, with  residual fatigue. Naps are taken rarely.    Review of Systems: Out of a complete 14 system review, the patient complains of only the following symptoms, and all other reviewed systems are negative.:  Fatigue, sleepiness, snoring- per husband and son. Son sleeps in another part of the house and hears her snoring.    How likely are you to doze in the following situations: 0 = not likely, 1 = slight chance, 2 = moderate chance, 3 = high chance   Sitting and Reading? Watching Television? Sitting inactive in a public place (theater or meeting)? As a passenger in a car for an hour without a break? Lying down in the afternoon when circumstances permit? Sitting and talking to someone? Sitting quietly after lunch without alcohol? In a car, while stopped for a few minutes in traffic?   Total = 4/ 24 points   FSS endorsed at 35/ 63 points.   Social History   Socioeconomic History  . Marital status: Divorced    Spouse name: Not on file  . Number of children: 3  . Years of education: 10th grade  . Highest education level: Not on file  Occupational History  . Occupation: Animal nutritionist  Tobacco Use  . Smoking status: Current Every Day Smoker  Years: 48.00    Types: Cigarettes    Start date: 03/17/1975  . Smokeless tobacco: Never Used  . Tobacco comment: 2 packs per week  Substance and Sexual Activity  . Alcohol use: No  . Drug use: Never  . Sexual activity: Not on file  Other Topics Concern  . Not on file  Social History Narrative   Lives at home with husband.   Right-handed.   Three cups caffeine per day.   Social Determinants of Health   Financial Resource Strain:   . Difficulty of Paying Living Expenses:   Food Insecurity:   . Worried About Charity fundraiser in the Last Year:   . Arboriculturist in the Last Year:   Transportation Needs:   . Film/video editor (Medical):   Marland Kitchen Lack of Transportation (Non-Medical):   Physical Activity:   . Days of Exercise per  Week:   . Minutes of Exercise per Session:   Stress:   . Feeling of Stress :   Social Connections:   . Frequency of Communication with Friends and Family:   . Frequency of Social Gatherings with Friends and Family:   . Attends Religious Services:   . Active Member of Clubs or Organizations:   . Attends Archivist Meetings:   Marland Kitchen Marital Status:     Family History  Problem Relation Age of Onset  . Heart attack Mother   . CAD Mother   . CAD Father   . Heart attack Father   . Hypertension Father   . Diabetes Father   . Colon cancer Neg Hx     Past Medical History:  Diagnosis Date  . Anxiety   . Atrial fibrillation ([place])   . Hypercholesteremia   . Seasonal allergies     Past Surgical History:  Procedure Laterality Date  . CHOLECYSTECTOMY    . COLONOSCOPY N/A 04/07/2014   Procedure: COLONOSCOPY;  Surgeon: Danie Binder, MD;  [facility];  Service: Endoscopy;  Laterality: N/A;  8:30 AM     Current Outpatient Medications on File Prior to Visit  Medication Sig Dispense Refill  . amLODipine-valsartan (EXFORGE) 10-160 MG tablet Take 1 tablet by mouth daily.    . metoprolol succinate (TOPROL-XL) 50 MG 24 hr tablet Take 1 tablet (50 mg total) by mouth daily. 30 tablet 6  . pravastatin (PRAVACHOL) 20 MG tablet Take 20 mg by mouth daily.    . rivaroxaban (XARELTO) 20 MG TABS tablet Take 1 tablet (20 mg total) by mouth daily with supper. 28 tablet 0   No current facility-administered medications on file prior to visit.    Physical exam:  Today's Vitals   11/19/19 0942  BP: 134/85  Pulse: 82  Temp: (!) 97 F (36.1 C)  Weight: 217 lb 8 oz (98.7 kg)  Height: 5\' 7"  (1.702 m)   Body mass index is 34.07 kg/m.   Wt Readings from Last 3 Encounters:  11/19/19 217 lb 8 oz (98.7 kg)  10/31/19 218 lb 12.8 oz (99.2 kg)  04/07/14 220 lb (99.8 kg)     Ht Readings from Last 3 Encounters:  11/19/19 5\' 7"  (1.702 m)  10/31/19 5\' 7"  (1.702 m)  04/07/14 5\' 8"   (1.727 m)      General: The patient is awake, alert and appears not in acute distress. The patient is well groomed. Head: Normocephalic, atraumatic. Neck is supple. Mallampati 3 - uvula is hard to see. Large tongue.  ,  neck circumference:16.5 inches . Nasal airflow patent.  Retrognathia is not seen.  Dental status: dentures.  Cardiovascular:  Regular rate and cardiac rhythm by pulse,  without distended neck veins. Respiratory: Lungs are clear to auscultation.  Skin:  Without evidence of ankle edema, or rash. Trunk: The patient's posture is erect.   Neurologic exam : The patient is awake and alert, oriented to place and time.   Memory subjective described as intact.  Attention span & concentration ability appears normal.  Speech is fluent,  without  dysarthria, dysphonia or aphasia.  Mood and affect are appropriate.   Cranial nerves: no loss of smell or taste reported  Pupils are equal and briskly reactive to light. Funduscopic exam - deferred.   Extraocular movements in vertical and horizontal planes were intact and without nystagmus. No Diplopia. Visual fields by finger perimetry are intact. Hearing was intact to soft voice and finger rubbing.   Facial sensation intact to fine touch.  Facial motor strength is symmetric and tongue and uvula move midline.  Neck ROM : rotation, tilt and flexion extension were normal for age and shoulder shrug was symmetrical.    Motor exam:  Symmetric bulk, tone and ROM.   Normal tone without cog wheeling, symmetric grip strength- strong(!) .   Sensory:  Fine touch, pinprick and vibration were tested  and  normal.  Proprioception tested in the upper extremities was normal.   Coordination: Rapid alternating movements in the fingers/hands were of normal speed.  The Finger-to-nose maneuver was intact without evidence of ataxia, dysmetria or tremor.   Gait and station: Patient could rise unassisted from a seated position, walked without assistive  device.  Stance is of normal width/ base and the patient turned with 3 steps.  Toe and heel walk were deferred.  Deep tendon reflexes: in the  upper and lower extremities are symmetric and intact.  Babinski response was deferred .    The patient learned about her atrial fibrillation only in early March, had SOB. EKG and chest auscultation by PCP were abnormal. Cardiology consulted - atrial fibrillation diagnoses.   Mrs. Stigler also has a history of wheezing, snoring, easy bruising, numbness in her feet, anxiety, the feeling of not getting enough sleep which may be true objectively, she had recently palpitations she has had swelling in her legs but today her ankles do not look too puffy.  The EKG was performed on 31 October 2019 and Dr. Bronson Ing reviewed it and it showed a poor atrial fibrillation with rapid ventricular response and PVCs.  She had started on Toprol for rate control and Xarelto as a blood thinner.  She had experienced palpitations probably for at least a month before presenting to the family doctor.  The patient was dropped is not because of stress she is a Freight forwarder at [employer] which means that she is not just the first 1 to be at the restaurant but also to oversee about a dozen people and an average of maybe 10 to 12 hours.  6 days a week.   After spending a total time of  40 minutes face to face and additional time for physical and neurologic examination, review of laboratory studies,  personal review of imaging studies, reports and results of other testing and review of referral information / records as far as provided in visit, I have established the following assessments:  1) Atrial fibrillation and OSA are closely related, and here is a patient who also has a BMI of 34 and narrow  airway, plus a 44 year history of tobacco use.      My Plan is to proceed with:  1)sleep study, I prefer an attended sleep study but the [employer] policy os unlikely to cover this. I will order  both, HST and PSG.    I would like to thank Sharilyn Sites, MD and Herminio Commons, [place],  [place] [address] for allowing me to meet with and to take care of this pleasant patient.   In short, GENELLA SCHOUTEN is presenting with a fib.   I plan to follow up either personally or through our NP within 2-3 month.     Electronically signed by: Larey Seat, MD 11/19/2019 10:16 AM  [facility] Board certified by The [employer] of Sleep Medicine and Diplomate of the [employer] of Sleep Medicine. Board certified In Neurology through the [place], Fellow of the [employer] of Neurology. Medical Director of [facility].

## 2019-11-19 NOTE — Addendum Note (Signed)
Addended by: Larey Seat on: 11/19/2019 10:47 AM   Modules accepted: Orders

## 2019-11-19 NOTE — Patient Instructions (Signed)

## 2019-11-21 ENCOUNTER — Ambulatory Visit (INDEPENDENT_AMBULATORY_CARE_PROVIDER_SITE_OTHER): Payer: Managed Care, Other (non HMO)

## 2019-11-21 ENCOUNTER — Other Ambulatory Visit: Payer: Self-pay

## 2019-11-21 DIAGNOSIS — I4891 Unspecified atrial fibrillation: Secondary | ICD-10-CM

## 2019-11-25 ENCOUNTER — Telehealth: Payer: Self-pay | Admitting: *Deleted

## 2019-11-25 NOTE — Telephone Encounter (Signed)
Laurine Blazer, [place]  [id number] [id number] PM EDT    Patient notified. Copy to pmd. Follow up scheduled for June.

## 2019-11-25 NOTE — Telephone Encounter (Signed)
-----   Message from Jade Commons, MD sent at 11/21/2019  4:34 PM EDT ----- Cardiac function is moderately reduced.  Discontinue amlodipine.  Left atrial size is severely enlarged which would make trying to convert her back to sinus rhythm difficult.  I will aim to control her rate with Toprol-XL.

## 2019-12-01 ENCOUNTER — Inpatient Hospital Stay (HOSPITAL_COMMUNITY)
Admission: EM | Admit: 2019-12-01 | Discharge: 2019-12-03 | DRG: 287 | Disposition: A | Discharge status: Home / Self Care | Payer: Managed Care, Other (non HMO) | Attending: Cardiovascular Disease | Admitting: Cardiovascular Disease

## 2019-12-01 ENCOUNTER — Other Ambulatory Visit: Payer: Self-pay

## 2019-12-01 ENCOUNTER — Encounter (HOSPITAL_COMMUNITY): Payer: Self-pay

## 2019-12-01 ENCOUNTER — Emergency Department (HOSPITAL_COMMUNITY): Payer: Managed Care, Other (non HMO)

## 2019-12-01 DIAGNOSIS — Z8249 Family history of ischemic heart disease and other diseases of the circulatory system: Secondary | ICD-10-CM

## 2019-12-01 DIAGNOSIS — R079 Chest pain, unspecified: Secondary | ICD-10-CM | POA: Diagnosis present

## 2019-12-01 DIAGNOSIS — E7800 Pure hypercholesterolemia, unspecified: Secondary | ICD-10-CM | POA: Diagnosis present

## 2019-12-01 DIAGNOSIS — E785 Hyperlipidemia, unspecified: Secondary | ICD-10-CM | POA: Diagnosis present

## 2019-12-01 DIAGNOSIS — Z6833 Body mass index (BMI) 33.0-33.9, adult: Secondary | ICD-10-CM

## 2019-12-01 DIAGNOSIS — F17210 Nicotine dependence, cigarettes, uncomplicated: Secondary | ICD-10-CM | POA: Diagnosis present

## 2019-12-01 DIAGNOSIS — I209 Angina pectoris, unspecified: Principal | ICD-10-CM | POA: Diagnosis present

## 2019-12-01 DIAGNOSIS — Z79891 Long term (current) use of opiate analgesic: Secondary | ICD-10-CM

## 2019-12-01 DIAGNOSIS — R0789 Other chest pain: Secondary | ICD-10-CM | POA: Diagnosis not present

## 2019-12-01 DIAGNOSIS — I428 Other cardiomyopathies: Secondary | ICD-10-CM | POA: Diagnosis present

## 2019-12-01 DIAGNOSIS — I10 Essential (primary) hypertension: Secondary | ICD-10-CM | POA: Diagnosis present

## 2019-12-01 DIAGNOSIS — I480 Paroxysmal atrial fibrillation: Secondary | ICD-10-CM | POA: Diagnosis present

## 2019-12-01 DIAGNOSIS — I4819 Other persistent atrial fibrillation: Secondary | ICD-10-CM | POA: Diagnosis present

## 2019-12-01 DIAGNOSIS — Z91048 Other nonmedicinal substance allergy status: Secondary | ICD-10-CM

## 2019-12-01 DIAGNOSIS — F419 Anxiety disorder, unspecified: Secondary | ICD-10-CM | POA: Diagnosis present

## 2019-12-01 DIAGNOSIS — E669 Obesity, unspecified: Secondary | ICD-10-CM | POA: Diagnosis present

## 2019-12-01 DIAGNOSIS — Z79899 Other long term (current) drug therapy: Secondary | ICD-10-CM

## 2019-12-01 DIAGNOSIS — Z20822 Contact with and (suspected) exposure to covid-19: Secondary | ICD-10-CM | POA: Diagnosis present

## 2019-12-01 LAB — HEPATIC FUNCTION PANEL
ALT: 15 U/L (ref 0–44)
AST: 18 U/L (ref 15–41)
Albumin: 3.9 g/dL (ref 3.5–5.0)
Alkaline Phosphatase: 82 U/L (ref 38–126)
Bilirubin, Direct: 0.1 mg/dL (ref 0.0–0.2)
Indirect Bilirubin: 0.3 mg/dL (ref 0.3–0.9)
Total Bilirubin: 0.4 mg/dL (ref 0.3–1.2)
Total Protein: 7.1 g/dL (ref 6.5–8.1)

## 2019-12-01 LAB — BASIC METABOLIC PANEL
Anion gap: 8 (ref 5–15)
BUN: 21 mg/dL — ABNORMAL HIGH (ref 6–20)
CO2: 23 mmol/L (ref 22–32)
Calcium: 9.1 mg/dL (ref 8.9–10.3)
Chloride: 108 mmol/L (ref 98–111)
Creatinine, Ser: 0.89 mg/dL (ref 0.44–1.00)
GFR calc Af Amer: >60 mL/min (ref >60)
GFR calc non Af Amer: >60 mL/min (ref >60)
Glucose, Bld: 116 mg/dL — ABNORMAL HIGH (ref 70–99)
Potassium: 4 mmol/L (ref 3.5–5.1)
Sodium: 139 mmol/L (ref 135–145)

## 2019-12-01 LAB — LIPID PANEL
Cholesterol: 137 mg/dL (ref 0–200)
HDL: 35 mg/dL — ABNORMAL LOW (ref >40)
LDL Cholesterol: 79 mg/dL (ref 0–99)
Total CHOL/HDL Ratio: 3.9 RATIO
Triglycerides: 115 mg/dL (ref <150)
VLDL: 23 mg/dL (ref 0–40)

## 2019-12-01 LAB — TROPONIN I (HIGH SENSITIVITY)
Troponin I (High Sensitivity): 3 ng/L (ref <18)
Troponin I (High Sensitivity): 3 ng/L (ref <18)
Troponin I (High Sensitivity): 5 ng/L (ref <18)

## 2019-12-01 LAB — CBC
HCT: 42.7 % (ref 36.0–46.0)
Hemoglobin: 13.9 g/dL (ref 12.0–15.0)
MCH: 29.4 pg (ref 26.0–34.0)
MCHC: 32.6 g/dL (ref 30.0–36.0)
MCV: 90.3 fL (ref 80.0–100.0)
Platelets: 170 10*3/uL (ref 150–400)
RBC: 4.73 MIL/uL (ref 3.87–5.11)
RDW: 13.3 % (ref 11.5–15.5)
WBC: 7.8 10*3/uL (ref 4.0–10.5)
nRBC: 0 % (ref 0.0–0.2)

## 2019-12-01 LAB — HIV ANTIBODY (ROUTINE TESTING W REFLEX): HIV Screen 4th Generation wRfx: NONREACTIVE

## 2019-12-01 LAB — HEMOGLOBIN A1C
Hgb A1c MFr Bld: 5.7 % — ABNORMAL HIGH (ref 4.8–5.6)
Mean Plasma Glucose: 116.89 mg/dL

## 2019-12-01 LAB — RESPIRATORY PANEL BY RT PCR (FLU A&B, COVID)
Influenza A by PCR: NEGATIVE
Influenza B by PCR: NEGATIVE
SARS Coronavirus 2 by RT PCR: NEGATIVE

## 2019-12-01 LAB — HEPARIN LEVEL (UNFRACTIONATED): Heparin Unfractionated: 1 IU/mL — ABNORMAL HIGH (ref 0.30–0.70)

## 2019-12-01 LAB — APTT: aPTT: 43 seconds — ABNORMAL HIGH (ref 24–36)

## 2019-12-01 MED ORDER — METOPROLOL SUCCINATE ER 50 MG PO TB24
50.0000 mg | ORAL_TABLET | Freq: Every day | ORAL | Status: DC
  Administered 2019-12-02: 50 mg via ORAL
  Filled 2019-12-01 (×2): qty 1

## 2019-12-01 MED ORDER — HEPARIN (PORCINE) 25000 UT/250ML-% IV SOLN
1200.0000 [IU]/h | INTRAVENOUS | Status: DC
  Administered 2019-12-01: 1100 [IU]/h via INTRAVENOUS
  Filled 2019-12-01 (×3): qty 250

## 2019-12-01 MED ORDER — PRAVASTATIN SODIUM 10 MG PO TABS
20.0000 mg | ORAL_TABLET | Freq: Every day | ORAL | Status: DC
  Administered 2019-12-01 – 2019-12-02 (×2): 20 mg via ORAL
  Filled 2019-12-01 (×3): qty 2

## 2019-12-01 MED ORDER — SODIUM CHLORIDE 0.9% FLUSH
3.0000 mL | Freq: Two times a day (BID) | INTRAVENOUS | Status: DC
  Administered 2019-12-01 – 2019-12-02 (×4): 3 mL via INTRAVENOUS

## 2019-12-01 MED ORDER — ONDANSETRON HCL 4 MG PO TABS: 4.0000 mg | ORAL_TABLET | Freq: Four times a day (QID) | ORAL | Status: DC | PRN

## 2019-12-01 MED ORDER — NITROGLYCERIN 0.4 MG SL SUBL
0.4000 mg | SUBLINGUAL_TABLET | SUBLINGUAL | Status: DC | PRN
  Administered 2019-12-01: 09:00:00 0.4 mg via SUBLINGUAL
  Filled 2019-12-01: qty 1

## 2019-12-01 MED ORDER — ONDANSETRON HCL 4 MG/2ML IJ SOLN: 4.0000 mg | Freq: Four times a day (QID) | INTRAMUSCULAR | Status: DC | PRN

## 2019-12-01 MED ORDER — ACETAMINOPHEN 650 MG RE SUPP: 650.0000 mg | Freq: Four times a day (QID) | RECTAL | Status: DC | PRN

## 2019-12-01 MED ORDER — ACETAMINOPHEN 325 MG PO TABS: 650.0000 mg | ORAL_TABLET | Freq: Four times a day (QID) | ORAL | Status: DC | PRN

## 2019-12-01 MED ORDER — SODIUM CHLORIDE 0.9 % IV SOLN: 250.0000 mL | INTRAVENOUS | Status: DC | PRN

## 2019-12-01 MED ORDER — SODIUM CHLORIDE 0.9 % IV BOLUS
500.0000 mL | Freq: Once | INTRAVENOUS | Status: AC
  Administered 2019-12-01: 500 mL via INTRAVENOUS

## 2019-12-01 MED ORDER — SODIUM CHLORIDE 0.9% FLUSH
3.0000 mL | Freq: Once | INTRAVENOUS | Status: DC
  Administered 2019-12-01: 3 mL via INTRAVENOUS

## 2019-12-01 MED ORDER — SODIUM CHLORIDE 0.9% FLUSH: 3.0000 mL | INTRAVENOUS | Status: DC | PRN

## 2019-12-01 NOTE — ED Provider Notes (Signed)
[facility] Provider Note   CSN: [id number] Arrival date & time: 12/01/19  [id number]     History Chief Complaint  Patient presents with  . Chest Pain    Jade Bradley is a 59 y.o. female.  Patient complains of having chest pain for about 30 to 40 minutes today with shortness of breath and severe sweating.  Patient has some chest discomfort now  The history is provided by the patient. No language interpreter was used.  Chest Pain Pain location:  Substernal area Pain quality: aching   Pain radiates to:  Does not radiate Pain severity:  Moderate Onset quality:  Sudden Timing:  Constant Progression:  Worsening Chronicity:  New Context: not breathing   Relieved by:  Nothing Worsened by:  Nothing Ineffective treatments:  None tried Associated symptoms: no abdominal pain, no back pain, no cough, no fatigue and no headache   Risk factors: no aortic disease        Past Medical History:  Diagnosis Date  . Anxiety   . Atrial fibrillation ([place])   . Hypercholesteremia   . Seasonal allergies     Patient Active Problem List   Diagnosis Date Noted  . Angina pectoris, unspecified ([place]) 12/01/2019  . Paroxysmal atrial fibrillation ([place]) 11/19/2019  . Rapid palpitations 11/19/2019  . Loud snoring 11/19/2019  . Morbid obesity ([place]) 11/19/2019    Past Surgical History:  Procedure Laterality Date  . CHOLECYSTECTOMY    . COLONOSCOPY N/A 04/07/2014   Procedure: COLONOSCOPY;  Surgeon: Danie Binder, MD;  Location: [facility];  Service: Endoscopy;  Laterality: N/A;  8:30 AM     OB History   No obstetric history on file.     Family History  Problem Relation Age of Onset  . Heart attack Mother   . CAD Mother   . CAD Father   . Heart attack Father   . Hypertension Father   . Diabetes Father   . Colon cancer Neg Hx     Social History   Tobacco Use  . Smoking status: Current Every Day Smoker    Years: 48.00    Types: Cigarettes    Start  date: 03/17/1975  . Smokeless tobacco: Never Used  . Tobacco comment: 2 packs per week  Substance Use Topics  . Alcohol use: No  . Drug use: Never    Home Medications Prior to Admission medications   Medication Sig Start Date End Date Taking? Authorizing Provider  metoprolol succinate (TOPROL-XL) 50 MG 24 hr tablet Take 1 tablet (50 mg total) by mouth daily. 10/31/19  Yes Herminio Commons, MD  pravastatin (PRAVACHOL) 20 MG tablet Take 20 mg by mouth daily.   Yes [provider]  rivaroxaban (XARELTO) 20 MG TABS tablet Take 1 tablet (20 mg total) by mouth daily with supper. 11/04/19  Yes Herminio Commons, MD    Allergies    Patient has no known allergies.  Review of Systems   Review of Systems  Constitutional: Negative for appetite change and fatigue.  HENT: Negative for congestion, ear discharge and sinus pressure.   Eyes: Negative for discharge.  Respiratory: Negative for cough.   Cardiovascular: Positive for chest pain.  Gastrointestinal: Negative for abdominal pain and diarrhea.  Genitourinary: Negative for frequency and hematuria.  Musculoskeletal: Negative for back pain.  Skin: Negative for rash.  Neurological: Negative for seizures and headaches.  Psychiatric/Behavioral: Negative for hallucinations.    Physical Exam Updated Vital Signs BP 126/81  Pulse 84   Temp 97.8 F (36.6 C) (Oral)   Resp 19   Ht 5\' 8"  (1.727 m)   Wt 100.2 kg   LMP 08/11/2011   SpO2 98%   BMI 33.60 kg/m   Physical Exam Vitals and nursing note reviewed.  Constitutional:      Appearance: She is well-developed.  HENT:     Head: Normocephalic.     Nose: Nose normal.  Eyes:     General: No scleral icterus.    Conjunctiva/sclera: Conjunctivae normal.  Neck:     Thyroid: No thyromegaly.  Cardiovascular:     Rate and Rhythm: Normal rate and regular rhythm.     Heart sounds: No murmur. No friction rub. No gallop.   Pulmonary:     Breath sounds: No stridor. No wheezing or  rales.  Chest:     Chest wall: No tenderness.  Abdominal:     General: There is no distension.     Tenderness: There is no abdominal tenderness. There is no rebound.  Musculoskeletal:        General: Normal range of motion.     Cervical back: Neck supple.  Lymphadenopathy:     Cervical: No cervical adenopathy.  Skin:    Findings: No erythema or rash.  Neurological:     Mental Status: She is alert and oriented to person, place, and time.     Motor: No abnormal muscle tone.     Coordination: Coordination normal.  Psychiatric:        Behavior: Behavior normal.     ED Results / Procedures / Treatments   Labs (all labs ordered are listed, but only abnormal results are displayed) Labs Reviewed  BASIC METABOLIC PANEL - Abnormal; Notable for the following components:      Result Value   Glucose, Bld 116 (*)    BUN 21 (*)    All other components within normal limits  CBC  HEPATIC FUNCTION PANEL  TROPONIN I (HIGH SENSITIVITY)  TROPONIN I (HIGH SENSITIVITY)    EKG None  Radiology DG Chest 2 View  Result Date: 12/01/2019 CLINICAL DATA:  Chest pain.  Shortness of breath. EXAM: CHEST - 2 VIEW COMPARISON:  August 10, 2011 FINDINGS: Stable mild cardiomegaly. The hila, mediastinum, lungs, and pleura are otherwise unremarkable. IMPRESSION: No active cardiopulmonary disease. Electronically Signed   By: Dorise Bullion III M.D   On: 12/01/2019 09:51    Procedures Procedures (including critical care time)  Medications Ordered in ED Medications  sodium chloride flush (NS) 0.9 % injection 3 mL (has no administration in time range)  nitroGLYCERIN (NITROSTAT) SL tablet 0.4 mg (0.4 mg Sublingual Given 12/01/19 0906)  sodium chloride 0.9 % bolus 500 mL (0 mLs Intravenous Stopped 12/01/19 1028)    ED Course  I have reviewed the triage vital signs and the nursing notes.  Pertinent labs & imaging results that were available during my care of the patient were reviewed by me and considered  in my medical decision making (see chart for details).    MDM Rules/Calculators/A&P                      Patient with atypical chest pain.  She will be admitted to medicine with cardiology consult tomorrow     This patient presents to the ED for concern of chest pain, this involves an extensive number of treatment options, and is a complaint that carries with it a high risk of complications and morbidity.  The differential diagnosis includes MI or PE pneumonia   Lab Tests:   I Ordered, reviewed, and interpreted labs, which included CBC and chemistry and troponin all unremarkable  Medicines ordered:   I ordered medication nitroglycerin for chest pain  Imaging Studies ordered:   I ordered imaging studies which included chest x-ray and  I independently visualized and interpreted imaging which showed no acute disease  Additional history obtained:   Additional history obtained from husband  Previous records obtained and reviewed   Consultations Obtained:   I consulted hospitalist and discussed lab and imaging findings  Reevaluation:  After the interventions stated above, I reevaluated the patient and found patient improved with nitroglycerin  Critical Interventions:  .   Final Clinical Impression(s) / ED Diagnoses Final diagnoses:  Atypical chest pain    Rx / DC Orders ED Discharge Orders    None       Milton Ferguson, MD 12/01/19 1359

## 2019-12-01 NOTE — Progress Notes (Addendum)
[place] for heparin dosing Indication:  ACS/STEMI  No Known Allergies  Patient Measurements: Height: 5\' 8"  (172.7 cm) Weight: 100.2 kg (221 lb) IBW/kg (Calculated) : 63.9 Heparin Dosing Weight: HEPARIN DW (KG): 86  Vital Signs: Temp: 97.8 F (36.6 C) (04/25 0846) Temp Source: Oral (04/25 0846) BP: 126/81 (04/25 1130) Pulse Rate: 84 (04/25 1130)  Labs: Recent Labs    12/01/19 0857 12/01/19 1024  HGB 13.9  --   HCT 42.7  --   PLT 170  --   CREATININE 0.89  --   TROPONINIHS 3 3    Estimated Creatinine Clearance: 87.4 mL/min (by C-G formula based on SCr of 0.89 mg/dL).   Medical History: Past Medical History:  Diagnosis Date  . Anxiety   . Atrial fibrillation ([place])   . Hypercholesteremia   . Seasonal allergies     Medications:  Patient was taking rivaroxaban 20mg  daily for newly-diagnosed atrial fibrillation. Her last dose was on 11-30-19 at 1500.  Assessment: Pharmacy consulted to dose heparin infusion for this 44 yof  with chest pain.   Initial two sets of troponins are negative and EKG shows AF. CBC is WNL and CXR shows no acute findings.  Will monitor with aPTT until heparin level correlates with aPTT.       Goal of Therapy:  Heparin level 0.3-0.7 units/ml  aPTT Goal: 66-106 seconds Monitor platelets by anticoagulation protocol: Yes   Plan:  Give no heparin bolus d/t rivaroxaban PTA Start heparin infusion at 1100 units/hr Check aPTT  in 6-8 hours and daily while on heparin Daily heparin level and CBC Monitor signs and symptoms of bleeding   Despina Pole 12/01/2019,2:33 PM

## 2019-12-01 NOTE — ED Triage Notes (Signed)
Pt reports was at work and at [employer] had sudden onset of intense chest pain/pressure, sob, and became diaphoretic.  Reports lasted approx 35-59min.  Pt says still feels like has a brick on chest but denies any sob at this time.  Reports afib and says cardiologist took pt off of her bp medications last week.

## 2019-12-01 NOTE — H&P (Signed)
History and Physical    Jade Bradley [id number] DOB: 1961/03/23 DOA: 12/01/2019  PCP: Sharilyn Sites, MD  Cardiologist: Dr. Bronson Ing  Patient coming from: Work  Chief Complaint: Chest pressure  HPI: Jade Bradley is a 59 y.o. female with medical history significant for atrial fibrillation on Xarelto, anxiety, obesity, tobacco abuse, hypertension, possible OSA, LV dysfunction noted on 2D echocardiogram 11/21/2019 with LVEF 35-40% and global hypokinesis.  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  She became quite diaphoretic and was nauseous and also felt lightheaded.  She states that the pressure was quite intense for approximately 40 minutes and had her husband bring her to the ED.  She did not have any significant shortness of breath.  She has not had this pain before, but states that she was given nitroglycerin in the ED which helped her pain level significantly.  She recently was seen for new onset atrial fibrillation by her cardiologist Dr. Bronson Ing.  She had echocardiogram performed on 4/15 with findings as noted above and was taken off her amlodipine and maintained on metoprolol for heart rate and blood pressure control.  No cough, fever, chills, abdominal pain, or lower extremity edema noted.   ED Course: Stable vital signs noted and heart rate is controlled with atrial fibrillation noted on telemetry.  EKG initially demonstrates atrial fibrillation with heart rate 116 bpm.  2 view chest x-ray with no acute findings noted and troponins have been 3 for 2 sets.  Her labs are otherwise unremarkable.  Review of Systems: All others reviewed as noted above and otherwise negative.  Past Medical History:  Diagnosis Date  . Anxiety   . Atrial fibrillation ([place])   . Hypercholesteremia   . Seasonal allergies     Past Surgical History:    Procedure Laterality Date  . CHOLECYSTECTOMY    . COLONOSCOPY N/A 04/07/2014   Procedure: COLONOSCOPY;  Surgeon: Danie Binder, MD;  Location[facility];  Service: Endoscopy;  Laterality: N/A;  8:30 AM     reports that she has been smoking cigarettes. She started smoking about 44 years ago. She has smoked for the past 48.00 years. She has never used smokeless tobacco. She reports that she does not drink alcohol or use drugs.  No Known Allergies  Family History  Problem Relation Age of Onset  . Heart attack Mother   . CAD Mother   . CAD Father   . Heart attack Father   . Hypertension Father   . Diabetes Father   . Colon cancer Neg Hx     Prior to Admission medications   Medication Sig Start Date End Date Taking? Authorizing Provider  metoprolol succinate (TOPROL-XL) 50 MG 24 hr tablet Take 1 tablet (50 mg total) by mouth daily. 10/31/19  Yes Herminio Commons, MD  pravastatin (PRAVACHOL) 20 MG tablet Take 20 mg by mouth daily.   Yes [provider]  rivaroxaban (XARELTO) 20 MG TABS tablet Take 1 tablet (20 mg total) by mouth daily with supper. 11/04/19  Yes Herminio Commons, MD    Physical Exam: Vitals:   12/01/19 1030 12/01/19 1045 12/01/19 1100 12/01/19 1130  BP: 123/89  106/85 126/81  Pulse: 81 61 78 84  Resp: 18 13 20 19   Temp:      TempSrc:      SpO2: 98% 99% 100%  98%  Weight:      Height:        Constitutional: NAD, calm, comfortable, obese Vitals:   12/01/19 1030 12/01/19 1045 12/01/19 1100 12/01/19 1130  BP: 123/89  106/85 126/81  Pulse: 81 61 78 84  Resp: 18 13 20 19   Temp:      TempSrc:      SpO2: 98% 99% 100% 98%  Weight:      Height:       Eyes: lids and conjunctivae normal ENMT: Mucous membranes are moist.  Neck: normal, supple Respiratory: clear to auscultation bilaterally. Normal respiratory effort. No accessory muscle use.  Cardiovascular: Irregular rhythm, no murmurs. No extremity edema. Abdomen: no tenderness, no  distention. Bowel sounds positive.  Musculoskeletal:  No joint deformity upper and lower extremities.   Skin: no rashes, lesions, ulcers.  Psychiatric: Normal judgment and insight. Alert and oriented x 3. Normal mood.   Labs on Admission: I have personally reviewed following labs and imaging studies  CBC: Recent Labs  Lab 12/01/19 0857  WBC 7.8  HGB 13.9  HCT 42.7  MCV 90.3  PLT 123XX123   Basic Metabolic Panel: Recent Labs  Lab 12/01/19 0857  NA 139  K 4.0  CL 108  CO2 23  GLUCOSE 116*  BUN 21*  CREATININE 0.89  CALCIUM 9.1   GFR: Estimated Creatinine Clearance: 87.4 mL/min (by C-G formula based on SCr of 0.89 mg/dL). Liver Function Tests: Recent Labs  Lab 12/01/19 0857  AST 18  ALT 15  ALKPHOS 82  BILITOT 0.4  PROT 7.1  ALBUMIN 3.9   No results for input(s): LIPASE, AMYLASE in the last 168 hours. No results for input(s): AMMONIA in the last 168 hours. Coagulation Profile: No results for input(s): INR, PROTIME in the last 168 hours. Cardiac Enzymes: No results for input(s): CKTOTAL, CKMB, CKMBINDEX, TROPONINI in the last 168 hours. BNP (last 3 results) No results for input(s): PROBNP in the last 8760 hours. HbA1C: No results for input(s): HGBA1C in the last 72 hours. CBG: No results for input(s): GLUCAP in the last 168 hours. Lipid Profile: No results for input(s): CHOL, HDL, LDLCALC, TRIG, CHOLHDL, LDLDIRECT in the last 72 hours. Thyroid Function Tests: No results for input(s): TSH, T4TOTAL, FREET4, T3FREE, THYROIDAB in the last 72 hours. Anemia Panel: No results for input(s): VITAMINB12, FOLATE, FERRITIN, TIBC, IRON, RETICCTPCT in the last 72 hours. Urine analysis: No results found for: COLORURINE, APPEARANCEUR, LABSPEC, [place], GLUCOSEU, HGBUR, BILIRUBINUR, KETONESUR, PROTEINUR, UROBILINOGEN, NITRITE, LEUKOCYTESUR  Radiological Exams on Admission: DG Chest 2 View  Result Date: 12/01/2019 CLINICAL DATA:  Chest pain.  Shortness of breath. EXAM: CHEST -  2 VIEW COMPARISON:  August 10, 2011 FINDINGS: Stable mild cardiomegaly. The hila, mediastinum, lungs, and pleura are otherwise unremarkable. IMPRESSION: No active cardiopulmonary disease. Electronically Signed   By: Dorise Bullion III M.D   On: 12/01/2019 09:51    EKG: Independently reviewed. Afib 116bpm.  Assessment/Plan Active Problems:   Angina pectoris, unspecified ([facility])    Angina in the setting of recently noted LV dysfunction -Patient has a heart score of 4 with multiple risk factors to include obesity, smoking, dyslipidemia, and hypertension -History is remarkable for angina given her presenting symptoms and relief with nitroglycerin -We will place on heparin drip for now and avoid Xarelto -Appreciate cardiology consultation in a.m. to consider possible inpatient stress test versus cath -Keep n.p.o. after midnight -We will obtain 2 more sets of troponins along with the EKG in a.m.  Atrial fibrillation-recent  diagnosis -Continue on metoprolol for heart rate control and monitor on telemetry -Heparin drip for anticoagulation and hold Xarelto for now pending further cardiology evaluation  Hypertension-controlled -Recently taken off amlodipine -Continue on metoprolol for heart rate and blood pressure control  Dyslipidemia -Continue on statin  Obesity -Lifestyle changes  Ongoing tobacco abuse -Counseled on cessation   DVT prophylaxis: Heparin drip, hold Xarelto Code Status: Full Family Communication: Husband at bedside Disposition Plan:Evaluation for inpatient workup per Cardiology in am Consults called:Cardiology consultation for am Admission status: Observation, Telemetry   Nikya Busler D Tamel Abel DO [facility]  If 7PM-7AM, please contact night-coverage [email or web address]  12/01/2019, 2:04 PM

## 2019-12-02 DIAGNOSIS — I4819 Other persistent atrial fibrillation: Secondary | ICD-10-CM | POA: Diagnosis present

## 2019-12-02 DIAGNOSIS — I10 Essential (primary) hypertension: Secondary | ICD-10-CM | POA: Diagnosis present

## 2019-12-02 DIAGNOSIS — Z79891 Long term (current) use of opiate analgesic: Secondary | ICD-10-CM | POA: Diagnosis not present

## 2019-12-02 DIAGNOSIS — R0789 Other chest pain: Secondary | ICD-10-CM | POA: Diagnosis present

## 2019-12-02 DIAGNOSIS — R072 Precordial pain: Secondary | ICD-10-CM | POA: Diagnosis not present

## 2019-12-02 DIAGNOSIS — E785 Hyperlipidemia, unspecified: Secondary | ICD-10-CM | POA: Diagnosis present

## 2019-12-02 DIAGNOSIS — Z91048 Other nonmedicinal substance allergy status: Secondary | ICD-10-CM | POA: Diagnosis not present

## 2019-12-02 DIAGNOSIS — F17210 Nicotine dependence, cigarettes, uncomplicated: Secondary | ICD-10-CM | POA: Diagnosis present

## 2019-12-02 DIAGNOSIS — I209 Angina pectoris, unspecified: Secondary | ICD-10-CM | POA: Diagnosis present

## 2019-12-02 DIAGNOSIS — Z79899 Other long term (current) drug therapy: Secondary | ICD-10-CM | POA: Diagnosis not present

## 2019-12-02 DIAGNOSIS — Z8249 Family history of ischemic heart disease and other diseases of the circulatory system: Secondary | ICD-10-CM | POA: Diagnosis not present

## 2019-12-02 DIAGNOSIS — Z6833 Body mass index (BMI) 33.0-33.9, adult: Secondary | ICD-10-CM | POA: Diagnosis not present

## 2019-12-02 DIAGNOSIS — R079 Chest pain, unspecified: Secondary | ICD-10-CM | POA: Diagnosis present

## 2019-12-02 DIAGNOSIS — E669 Obesity, unspecified: Secondary | ICD-10-CM | POA: Diagnosis present

## 2019-12-02 DIAGNOSIS — Z20822 Contact with and (suspected) exposure to covid-19: Secondary | ICD-10-CM | POA: Diagnosis present

## 2019-12-02 DIAGNOSIS — I428 Other cardiomyopathies: Secondary | ICD-10-CM | POA: Diagnosis present

## 2019-12-02 DIAGNOSIS — E7800 Pure hypercholesterolemia, unspecified: Secondary | ICD-10-CM | POA: Diagnosis present

## 2019-12-02 DIAGNOSIS — F419 Anxiety disorder, unspecified: Secondary | ICD-10-CM | POA: Diagnosis present

## 2019-12-02 LAB — BASIC METABOLIC PANEL
Anion gap: 8 (ref 5–15)
BUN: 14 mg/dL (ref 6–20)
CO2: 23 mmol/L (ref 22–32)
Calcium: 8.7 mg/dL — ABNORMAL LOW (ref 8.9–10.3)
Chloride: 108 mmol/L (ref 98–111)
Creatinine, Ser: 0.6 mg/dL (ref 0.44–1.00)
GFR calc Af Amer: >60 mL/min (ref >60)
GFR calc non Af Amer: >60 mL/min (ref >60)
Glucose, Bld: 109 mg/dL — ABNORMAL HIGH (ref 70–99)
Potassium: 3.8 mmol/L (ref 3.5–5.1)
Sodium: 139 mmol/L (ref 135–145)

## 2019-12-02 LAB — HEPARIN LEVEL (UNFRACTIONATED)
Heparin Unfractionated: 0.5 IU/mL (ref 0.30–0.70)
Heparin Unfractionated: 0.49 IU/mL (ref 0.30–0.70)

## 2019-12-02 LAB — CBC
HCT: 43.1 % (ref 36.0–46.0)
Hemoglobin: 13.9 g/dL (ref 12.0–15.0)
MCH: 29 pg (ref 26.0–34.0)
MCHC: 32.3 g/dL (ref 30.0–36.0)
MCV: 90 fL (ref 80.0–100.0)
Platelets: 165 10*3/uL (ref 150–400)
RBC: 4.79 MIL/uL (ref 3.87–5.11)
RDW: 13.2 % (ref 11.5–15.5)
WBC: 6.1 10*3/uL (ref 4.0–10.5)
nRBC: 0 % (ref 0.0–0.2)

## 2019-12-02 LAB — APTT
aPTT: 64 seconds — ABNORMAL HIGH (ref 24–36)
aPTT: 80 seconds — ABNORMAL HIGH (ref 24–36)
aPTT: 89 seconds — ABNORMAL HIGH (ref 24–36)

## 2019-12-02 LAB — MAGNESIUM: Magnesium: 2.2 mg/dL (ref 1.7–2.4)

## 2019-12-02 MED ORDER — LOSARTAN POTASSIUM 25 MG PO TABS
12.5000 mg | ORAL_TABLET | Freq: Every day | ORAL | Status: DC
  Administered 2019-12-02: 12.5 mg via ORAL
  Filled 2019-12-02 (×2): qty 1

## 2019-12-02 MED ORDER — ASPIRIN EC 81 MG PO TBEC
81.0000 mg | DELAYED_RELEASE_TABLET | Freq: Every day | ORAL | Status: DC
  Administered 2019-12-02: 81 mg via ORAL
  Filled 2019-12-02: qty 1

## 2019-12-02 NOTE — Progress Notes (Signed)
[place] for heparin dosing Indication:  ACS/STEMI  No Known Allergies  Patient Measurements: Height: 5\' 8"  (172.7 cm) Weight: 100.2 kg (221 lb) IBW/kg (Calculated) : 63.9 Heparin Dosing Weight: HEPARIN DW (KG): 86  Vital Signs: Temp: 98.1 F (36.7 C) (04/26 0650) Temp Source: Oral (04/25 2201) BP: 127/80 (04/26 0650) Pulse Rate: 63 (04/26 0650)  Labs: Recent Labs    12/01/19 0857 12/01/19 1024 12/01/19 1459 12/01/19 1912 12/01/19 2332 12/02/19 0509  HGB 13.9  --   --   --   --  13.9  HCT 42.7  --   --   --   --  43.1  PLT 170  --   --   --   --  165  APTT  --   --   --  43* 64* 80*  HEPARINUNFRC  --   --  1.00*  --   --  0.50  CREATININE 0.89  --   --   --   --  0.60  TROPONINIHS 3 3 5   --   --   --     Estimated Creatinine Clearance: 97.2 mL/min (by C-G formula based on SCr of 0.6 mg/dL).   Medical History: Past Medical History:  Diagnosis Date  . Anxiety   . Atrial fibrillation ([place])   . Hypercholesteremia   . Seasonal allergies     Medications:  Patient was taking rivaroxaban 20mg  daily for newly-diagnosed atrial fibrillation. Her last dose was on 11-30-19 at 1500.  Assessment: Pharmacy consulted to dose heparin infusion for this 22 yof  with chest pain.   Initial two sets of troponins are negative and EKG shows AF. CBC is WNL and CXR shows no acute findings.  12/02/19 0745 update: APTT: 80 seconds, within therapeutic goal range on heparin at 1200 units/hr HL: 0.50 IU/mL, now correlating with aPTT CBC: remains WNL RN reports no bleeding complications or issues with infusion site.        Goal of Therapy:  Heparin level 0.3-0.7 units/ml  aPTT Goal: 66-106 seconds Monitor platelets by anticoagulation protocol: Yes   Plan:   Continue  heparin infusion at 1200 units/hr Re-check aPTT  in 6-8 hours Daily heparin level and CBC Monitor signs and symptoms of bleeding   Despina Pole 12/02/2019,7:43 AM

## 2019-12-02 NOTE — Progress Notes (Signed)
[place] for heparin dosing Indication:  ACS/STEMI  No Known Allergies  Patient Measurements: Height: 5\' 8"  (172.7 cm) Weight: 100.2 kg (221 lb) IBW/kg (Calculated) : 63.9 Heparin Dosing Weight: HEPARIN DW (KG): 86  Vital Signs: Temp: 98.2 F (36.8 C) (04/26 1050) BP: 124/84 (04/26 1050) Pulse Rate: 70 (04/26 1050)  Labs: Recent Labs    12/01/19 0857 12/01/19 1024 12/01/19 1459 12/01/19 1912 12/01/19 2332 12/02/19 0509 12/02/19 0854 12/02/19 1059  HGB 13.9  --   --   --   --  13.9  --   --   HCT 42.7  --   --   --   --  43.1  --   --   PLT 170  --   --   --   --  165  --   --   APTT  --   --   --    < > 64* 80* 89*  --   HEPARINUNFRC  --   --  1.00*  --   --  0.50  --  0.49  CREATININE 0.89  --   --   --   --  0.60  --   --   TROPONINIHS 3 3 5   --   --   --   --   --    < > = values in this interval not displayed.    Estimated Creatinine Clearance: 97.2 mL/min (by C-G formula based on SCr of 0.6 mg/dL).   Medical History: Past Medical History:  Diagnosis Date  . Anxiety   . Atrial fibrillation ([place])   . Hypercholesteremia   . Seasonal allergies     Medications:  Patient was taking rivaroxaban 20mg  daily for newly-diagnosed atrial fibrillation. Her last dose was on 11-30-19 at 1500.  Assessment: Pharmacy consulted to dose heparin infusion for this 6 yof  with chest pain.   Initial two sets of troponins are negative and EKG shows AF. CBC is WNL and CXR shows no acute findings.  12/02/19 1230  update: aPTT: 89 seconds, within therapeutic goal range on heparin at 1200 units/hr HL: 0.49 IU/mL, now correlating with aPTT CBC: remains WNL RN reports no bleeding complications or issues with infusion site.        Goal of Therapy:  Heparin level 0.3-0.7 units/ml  aPTT Goal: 66-106 seconds Monitor platelets by anticoagulation protocol: Yes   Plan:   Continue  heparin infusion at 1200 units/hr  Daily heparin level and  CBC Monitor signs and symptoms of bleeding   Despina Pole 12/02/2019,12:27 PM

## 2019-12-02 NOTE — H&P (View-Only) (Signed)
Cardiology Consultation:   Patient ID: Jade Bradley MRN: [id number]; DOB: [birth year]  Admit date: 12/01/2019 Date of Consult: 12/02/2019  Primary Care Provider: Sharilyn Sites, MD Primary Cardiologist: Kate Sable, MD  Primary Electrophysiologist:  None    Patient Profile:   Jade Bradley is a 59 y.o. female with a hx of afib, tobacco abuse, HTN, HL who is being seen today for the evaluation of chest pain at the request of Dr Manuella Ghazi.  History of Present Illness:   Jade Bradley 59 yo female history of fairly new diagnos of afib seen by Dr Bronson Ing as outpatient in 10/2019, cardioversion considered by not pursued after echo showed severe LAE. Also history of tobacco abuse, HL, HTN,  new diagnosis of systolic dysfunction by 123XX123 echo LVEF 35-40%. .   Presents with chest pain. Episode occurred while at work at [employer]. 9/10 pressure midchest with significant diaphoresis and SOB. Sat down with some improvement in symptoms after 15-20 minutes. Came to ER, ongoing mild symptoms that resolved with SL NG. No recurrent pain.    CAD risk factors: HTN, tobacco abuse x 44 years, HL  K 4 Cr 0.89 WBC 7.8 Hgb 13.9 Plt 170 LDL 79  hstrop 3-->3-->5 EKG afib elevated rates, nonspecific ST/T changes COVID neg  CXR no acute process 11/2019 echo LVE 35-40%, global hypokinesis, normal RV, severe LAE Past Medical History:  Diagnosis Date  . Anxiety   . Atrial fibrillation ([place])   . Hypercholesteremia   . Seasonal allergies     Past Surgical History:  Procedure Laterality Date  . CHOLECYSTECTOMY    . COLONOSCOPY N/A 04/07/2014   Procedure: COLONOSCOPY;  Surgeon: Danie Binder, MD;  [facility];  Service: Endoscopy;  Laterality: N/A;  8:30 AM     Inpatient Medications: Scheduled Meds: . metoprolol succinate  50 mg Oral Daily  . pravastatin  20 mg Oral Daily  . sodium chloride flush  3 mL Intravenous Q12H   Continuous Infusions: . sodium chloride    . heparin  1,200 Units/hr (12/02/19 0035)   PRN Meds: sodium chloride, acetaminophen **OR** acetaminophen, nitroGLYCERIN, ondansetron **OR** ondansetron (ZOFRAN) IV, sodium chloride flush  Allergies:   No Known Allergies  Social History:   Social History   Socioeconomic History  . Marital status: Divorced    Spouse name: Not on file  . Number of children: 3  . Years of education: 10th grade  . Highest education level: Not on file  Occupational History  . Occupation: Animal nutritionist  Tobacco Use  . Smoking status: Current Every Day Smoker    Years: 48.00    Types: Cigarettes    Start date: 03/17/1975  . Smokeless tobacco: Never Used  . Tobacco comment: 2 packs per week  Substance and Sexual Activity  . Alcohol use: No  . Drug use: Never  . Sexual activity: Not on file  Other Topics Concern  . Not on file  Social History Narrative   Lives at home with husband.   Right-handed.   Three cups caffeine per day.   Social Determinants of Health   Financial Resource Strain:   . Difficulty of Paying Living Expenses:   Food Insecurity:   . Worried About Charity fundraiser in the Last Year:   . Arboriculturist in the Last Year:   Transportation Needs:   . Film/video editor (Medical):   Marland Kitchen Lack of Transportation (Non-Medical):   Physical Activity:   . Days of Exercise per  Week:   . Minutes of Exercise per Session:   Stress:   . Feeling of Stress :   Social Connections:   . Frequency of Communication with Friends and Family:   . Frequency of Social Gatherings with Friends and Family:   . Attends Religious Services:   . Active Member of Clubs or Organizations:   . Attends Archivist Meetings:   Marland Kitchen Marital Status:   Intimate Partner Violence:   . Fear of Current or Ex-Partner:   . Emotionally Abused:   Marland Kitchen Physically Abused:   . Sexually Abused:     Family History:    Family History  Problem Relation Age of Onset  . Heart attack Mother   . CAD Mother   . CAD  Father   . Heart attack Father   . Hypertension Father   . Diabetes Father   . Colon cancer Neg Hx      ROS:  Please see the history of present illness.  All other ROS reviewed and negative.     Physical Exam/Data:   Vitals:   12/02/19 0017 12/02/19 0218 12/02/19 0415 12/02/19 0650  BP: (!) 144/82 111/75 121/70 127/80  Pulse: 71 68 63 63  Resp: 20 20 20 20   Temp: 98 F (36.7 C) 97.8 F (36.6 C) 98 F (36.7 C) 98.1 F (36.7 C)  TempSrc:      SpO2: 95% 92% 96% 99%  Weight:      Height:        Intake/Output Summary (Last 24 hours) at 12/02/2019 0841 Last data filed at 12/02/2019 0300 Gross per 24 hour  Intake 343.03 ml  Output --  Net 343.03 ml   Last 3 Weights 12/01/2019 11/19/2019 10/31/2019  Weight (lbs) 221 lb 217 lb 8 oz 218 lb 12.8 oz  Weight (kg) 100.245 kg 98.657 kg 99.247 kg     Body mass index is 33.6 kg/m.  General:  Well nourished, well developed, in no acute distress HEENT: normal Lymph: no adenopathy Neck: no JVD Endocrine:  No thryomegaly Vascular: No carotid bruits; FA pulses 2+ bilaterally without bruits  Cardiac:  irreg no murmur  Lungs:  clear to auscultation bilaterally, no wheezing, rhonchi or rales  Abd: soft, nontender, no hepatomegaly  Ext: no edema Musculoskeletal:  No deformities, BUE and BLE strength normal and equal Skin: warm and dry  Neuro:  CNs 2-12 intact, no focal abnormalities noted Psych:  Normal affect    Laboratory Data:  High Sensitivity Troponin:   Recent Labs  Lab 12/01/19 0857 12/01/19 1024 12/01/19 1459  TROPONINIHS 3 3 5      Chemistry Recent Labs  Lab 12/01/19 0857 12/02/19 0509  NA 139 139  K 4.0 3.8  CL 108 108  CO2 23 23  GLUCOSE 116* 109*  BUN 21* 14  CREATININE 0.89 0.60  CALCIUM 9.1 8.7*  GFRNONAA >60 >60  GFRAA >60 >60  ANIONGAP 8 8    Recent Labs  Lab 12/01/19 0857  PROT 7.1  ALBUMIN 3.9  AST 18  ALT 15  ALKPHOS 82  BILITOT 0.4   Hematology Recent Labs  Lab 12/01/19 0857  12/02/19 0509  WBC 7.8 6.1  RBC 4.73 4.79  HGB 13.9 13.9  HCT 42.7 43.1  MCV 90.3 90.0  MCH 29.4 29.0  MCHC 32.6 32.3  RDW 13.3 13.2  PLT 170 165   BNPNo results for input(s): BNP, PROBNP in the last 168 hours.  DDimer No results for input(s): DDIMER in the last  168 hours.   Radiology/Studies:  DG Chest 2 View  Result Date: 12/01/2019 CLINICAL DATA:  Chest pain.  Shortness of breath. EXAM: CHEST - 2 VIEW COMPARISON:  August 10, 2011 FINDINGS: Stable mild cardiomegaly. The hila, mediastinum, lungs, and pleura are otherwise unremarkable. IMPRESSION: No active cardiopulmonary disease. Electronically Signed   By: Dorise Bullion III M.D   On: 12/01/2019 09:51       HEAR Score (for undifferentiated chest pain):       Assessment and Plan:   1. Chest pain - CAD risk factors including 44 year tobacco history, HTN, HL - very typical symptoms that resolved with NG in the ER - no objective evidence of ischemia by ekg or enzymes - outpatient echo 11/21/19 LVEF 35-40%, raising suspicion even high for possible ischemic heart disease  - medical therapy with hep gtt, start ASA while off xarelto, toprol 50, prava 20 (if CAD confirmed chagne to more potent statin). Start losartan 12.5mg  daily.  - last xarelto dose Saturday afternoon  2. LV systolic dysfunction - outpatient echo 11/21/19 with LVE 35-40% - thought likely rate related CM given her recent diagnosis of afib and elevated rates - in presenting with chest pain raises concern for possible ICM  - continue toprol, starting losartan 12.5mg  daily. After cath if tolerated may transition to entresto as intpatient vs outpatient.    3. Persistent Afib - fairly new diagnosis, evaluated 10/2019 in cardiology clinic - rate controled on toprol - DCCV not pursued as outpatient due to severe LAE. LAVI 41 - xarelto on hold, on hep gtt in case needs invasive procedures. Last xarelto dose Saturday afternoon   We will transfer to [facility], plan  for RHC/LHC tomorrow.   I have reviewed the risks, indications, and alternatives to cardiac catheterization, possible angioplasty, and stenting with the patient and her husband today. Risks include but are not limited to bleeding, infection, vascular injury, stroke, myocardial infection, arrhythmia, kidney injury, radiation-related injury in the case of prolonged fluoroscopy use, emergency cardiac surgery, and death. The patient understands the risks of serious complication is 1-2 in 123XX123 with diagnostic cardiac cath and 1-2% or less with angioplasty/stenting.      For questions or updates, please contact [place] Please consult [email or web address] for contact info under     Signed, Carlyle Dolly, MD  12/02/2019 8:41 AM

## 2019-12-02 NOTE — Progress Notes (Signed)
Cardiology Consultation:   Patient ID: Jade Bradley MRN: [id number]; DOB: [birth year]  Admit date: 12/01/2019 Date of Consult: 12/02/2019  Primary Care Provider: Sharilyn Sites, MD Primary Cardiologist: Kate Sable, MD  Primary Electrophysiologist:  None    Patient Profile:   Jade Bradley is a 59 y.o. Bradley with a hx of afib, tobacco abuse, HTN, HL who is being seen today for the evaluation of chest pain at the request of Dr Manuella Ghazi.  History of Present Illness:   Jade Bradley 59 Bradley Bradley history of fairly new diagnos of afib seen by Dr Bronson Ing as outpatient in 10/2019, cardioversion considered by not pursued after echo showed severe LAE. Also history of tobacco abuse, HL, HTN,  new diagnosis of systolic dysfunction by 123XX123 echo LVEF 35-40%. .   Presents with chest pain. Episode occurred while at work at [employer]. 9/10 pressure midchest with significant diaphoresis and SOB. Sat down with some improvement in symptoms after 15-20 minutes. Came to ER, ongoing mild symptoms that resolved with SL NG. No recurrent pain.    CAD risk factors: HTN, tobacco abuse x 44 years, HL  K 4 Cr 0.89 WBC 7.8 Hgb 13.9 Plt 170 LDL 79  hstrop 3-->3-->5 EKG afib elevated rates, nonspecific ST/T changes COVID neg  CXR no acute process 11/2019 echo LVE 35-40%, global hypokinesis, normal RV, severe LAE Past Medical History:  Diagnosis Date  . Anxiety   . Atrial fibrillation ([place])   . Hypercholesteremia   . Seasonal allergies     Past Surgical History:  Procedure Laterality Date  . CHOLECYSTECTOMY    . COLONOSCOPY N/A 04/07/2014   Procedure: COLONOSCOPY;  Surgeon: Danie Binder, MD;  [facility];  Service: Endoscopy;  Laterality: N/A;  8:30 AM     Inpatient Medications: Scheduled Meds: . metoprolol succinate  50 mg Oral Daily  . pravastatin  20 mg Oral Daily  . sodium chloride flush  3 mL Intravenous Q12H   Continuous Infusions: . sodium chloride    . heparin  1,200 Units/hr (12/02/19 0035)   PRN Meds: sodium chloride, acetaminophen **OR** acetaminophen, nitroGLYCERIN, ondansetron **OR** ondansetron (ZOFRAN) IV, sodium chloride flush  Allergies:   No Known Allergies  Social History:   Social History   Socioeconomic History  . Marital status: Divorced    Spouse name: Not on file  . Number of children: 3  . Years of education: 10th grade  . Highest education level: Not on file  Occupational History  . Occupation: Animal nutritionist  Tobacco Use  . Smoking status: Current Every Day Smoker    Years: 48.00    Types: Cigarettes    Start date: 03/17/1975  . Smokeless tobacco: Never Used  . Tobacco comment: 2 packs per week  Substance and Sexual Activity  . Alcohol use: No  . Drug use: Never  . Sexual activity: Not on file  Other Topics Concern  . Not on file  Social History Narrative   Lives at home with husband.   Right-handed.   Three cups caffeine per day.   Social Determinants of Health   Financial Resource Strain:   . Difficulty of Paying Living Expenses:   Food Insecurity:   . Worried About Charity fundraiser in the Last Year:   . Arboriculturist in the Last Year:   Transportation Needs:   . Film/video editor (Medical):   Marland Kitchen Lack of Transportation (Non-Medical):   Physical Activity:   . Days of Exercise per  Week:   . Minutes of Exercise per Session:   Stress:   . Feeling of Stress :   Social Connections:   . Frequency of Communication with Friends and Family:   . Frequency of Social Gatherings with Friends and Family:   . Attends Religious Services:   . Active Member of Clubs or Organizations:   . Attends Archivist Meetings:   Marland Kitchen Marital Status:   Intimate Partner Violence:   . Fear of Current or Ex-Partner:   . Emotionally Abused:   Marland Kitchen Physically Abused:   . Sexually Abused:     Family History:    Family History  Problem Relation Age of Onset  . Heart attack Mother   . CAD Mother   . CAD  Father   . Heart attack Father   . Hypertension Father   . Diabetes Father   . Colon cancer Neg Hx      ROS:  Please see the history of present illness.  All other ROS reviewed and negative.     Physical Exam/Data:   Vitals:   12/02/19 0017 12/02/19 0218 12/02/19 0415 12/02/19 0650  BP: (!) 144/82 111/75 121/70 127/80  Pulse: 71 68 63 63  Resp: 20 20 20 20   Temp: 98 F (36.7 C) 97.8 F (36.6 C) 98 F (36.7 C) 98.1 F (36.7 C)  TempSrc:      SpO2: 95% 92% 96% 99%  Weight:      Height:        Intake/Output Summary (Last 24 hours) at 12/02/2019 0841 Last data filed at 12/02/2019 0300 Gross per 24 hour  Intake 343.03 ml  Output -  Net 343.03 ml   Last 3 Weights 12/01/2019 11/19/2019 10/31/2019  Weight (lbs) 221 lb 217 lb 8 oz 218 lb 12.8 oz  Weight (kg) 100.245 kg 98.657 kg 99.247 kg     Body mass index is 33.6 kg/m.  General:  Well nourished, well developed, in no acute distress HEENT: normal Lymph: no adenopathy Neck: no JVD Endocrine:  No thryomegaly Vascular: No carotid bruits; FA pulses 2+ bilaterally without bruits  Cardiac:  irreg no murmur  Lungs:  clear to auscultation bilaterally, no wheezing, rhonchi or rales  Abd: soft, nontender, no hepatomegaly  Ext: no edema Musculoskeletal:  No deformities, BUE and BLE strength normal and equal Skin: warm and dry  Neuro:  CNs 2-12 intact, no focal abnormalities noted Psych:  Normal affect    Laboratory Data:  High Sensitivity Troponin:   Recent Labs  Lab 12/01/19 0857 12/01/19 1024 12/01/19 1459  TROPONINIHS 3 3 5      Chemistry Recent Labs  Lab 12/01/19 0857 12/02/19 0509  NA 139 139  K 4.0 3.8  CL 108 108  CO2 23 23  GLUCOSE 116* 109*  BUN 21* 14  CREATININE 0.89 0.60  CALCIUM 9.1 8.7*  GFRNONAA >60 >60  GFRAA >60 >60  ANIONGAP 8 8    Recent Labs  Lab 12/01/19 0857  PROT 7.1  ALBUMIN 3.9  AST 18  ALT 15  ALKPHOS 82  BILITOT 0.4   Hematology Recent Labs  Lab 12/01/19 0857  12/02/19 0509  WBC 7.8 6.1  RBC 4.73 4.79  HGB 13.9 13.9  HCT 42.7 43.1  MCV 90.3 90.0  MCH 29.4 29.0  MCHC 32.6 32.3  RDW 13.3 13.2  PLT 170 165   BNPNo results for input(s): BNP, PROBNP in the last 168 hours.  DDimer No results for input(s): DDIMER in the last  168 hours.   Radiology/Studies:  DG Chest 2 View  Result Date: 12/01/2019 CLINICAL DATA:  Chest pain.  Shortness of breath. EXAM: CHEST - 2 VIEW COMPARISON:  August 10, 2011 FINDINGS: Stable mild cardiomegaly. The hila, mediastinum, lungs, and pleura are otherwise unremarkable. IMPRESSION: No active cardiopulmonary disease. Electronically Signed   By: Dorise Bullion III M.D   On: 12/01/2019 09:51       HEAR Score (for undifferentiated chest pain):       Assessment and Plan:   1. Chest pain - CAD risk factors including 44 year tobacco history, HTN, HL - very typical symptoms that resolved with NG in the ER - no objective evidence of ischemia by ekg or enzymes - outpatient echo 11/21/19 LVEF 35-40%, raising suspicion even high for possible ischemic heart disease  - medical therapy with hep gtt, start ASA while off xarelto, toprol 50, prava 20 (if CAD confirmed chagne to more potent statin). Start losartan 12.5mg  daily.  - last xarelto dose Saturday afternoon  2. LV systolic dysfunction - outpatient echo 11/21/19 with LVE 35-40% - thought likely rate related CM given her recent diagnosis of afib and elevated rates - in presenting with chest pain raises concern for possible ICM  - continue toprol, starting losartan 12.5mg  daily. After cath if tolerated may transition to entresto as intpatient vs outpatient.    3. Persistent Afib - fairly new diagnosis, evaluated 10/2019 in cardiology clinic - rate controled on toprol - DCCV not pursued as outpatient due to severe LAE. LAVI 41 - xarelto on hold, on hep gtt in case needs invasive procedures. Last xarelto dose Saturday afternoon   We will transfer to [facility], plan  for RHC/LHC tomorrow.   I have reviewed the risks, indications, and alternatives to cardiac catheterization, possible angioplasty, and stenting with the patient and her husband today. Risks include but are not limited to bleeding, infection, vascular injury, stroke, myocardial infection, arrhythmia, kidney injury, radiation-related injury in the case of prolonged fluoroscopy use, emergency cardiac surgery, and death. The patient understands the risks of serious complication is 1-2 in 123XX123 with diagnostic cardiac cath and 1-2% or less with angioplasty/stenting.      For questions or updates, please contact [place] Please consult [email or web address] for contact info under     Signed, Carlyle Dolly, MD  12/02/2019 8:41 AM

## 2019-12-02 NOTE — Progress Notes (Signed)
[place] for heparin dosing Indication:  ACS/STEMI  No Known Allergies  Patient Measurements: Height: 5\' 8"  (172.7 cm) Weight: 100.2 kg (221 lb) IBW/kg (Calculated) : 63.9 Heparin Dosing Weight: HEPARIN DW (KG): 86  Vital Signs: Temp: 98.1 F (36.7 C) (04/25 2201) Temp Source: Oral (04/25 2201) BP: 131/89 (04/25 2201) Pulse Rate: 65 (04/25 2201)  Labs: Recent Labs    12/01/19 0857 12/01/19 1024 12/01/19 1459 12/01/19 1912 12/01/19 2332  HGB 13.9  --   --   --   --   HCT 42.7  --   --   --   --   PLT 170  --   --   --   --   APTT  --   --   --  43* 64*  HEPARINUNFRC  --   --  1.00*  --   --   CREATININE 0.89  --   --   --   --   TROPONINIHS 3 3 5   --   --     Estimated Creatinine Clearance: 87.4 mL/min (by C-G formula based on SCr of 0.89 mg/dL).  Assessment: 59 y.o. female admitted with chest pain, h/o Afib and Xarelto on hold, for heparin       Goal of Therapy:  Heparin level 0.3-0.7 units/ml  aPTT Goal: 66-102 seconds Monitor platelets by anticoagulation protocol: Yes   Plan:  Increase Heparin 1200 units/hr Follow-up am labs.   Caryl Pina 12/02/2019,12:15 AM

## 2019-12-02 NOTE — Progress Notes (Signed)
PROGRESS NOTE    Jade Bradley  [id number] DOB: [birth year] DOA: 12/01/2019 PCP: Sharilyn Sites, MD   Brief Narrative:  Per HPI: Jade Bradley is a 59 y.o. female with medical history significant for atrial fibrillation on Xarelto, anxiety, obesity, tobacco abuse, hypertension, possible OSA, LV dysfunction noted on 2D echocardiogram 11/21/2019 with LVEF 35-40% and global hypokinesis.  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  She became quite diaphoretic and was nauseous and also felt lightheaded.  She states that the pressure was quite intense for approximately 40 minutes and had her husband bring her to the ED.  She did not have any significant shortness of breath.  She has not had this pain before, but states that she was given nitroglycerin in the ED which helped her pain level significantly.  She recently was seen for new onset atrial fibrillation by her cardiologist Dr. Bronson Ing.  She had echocardiogram performed on 4/15 with findings as noted above and was taken off her amlodipine and maintained on metoprolol for heart rate and blood pressure control.  No cough, fever, chills, abdominal pain, or lower extremity edema noted.   Assessment & Plan:   Active Problems:   Angina pectoris, unspecified ([place])   Angina in the setting of recently noted LV dysfunction -Patient has a heart score of 4 with multiple risk factors to include obesity, smoking, dyslipidemia, and hypertension -History is remarkable for angina given her presenting symptoms and relief with nitroglycerin -Continue heparin drip for now -Appreciate cardiology consultation today with plans for heart catheterization by 4/27 -No signs of any acute ischemia at this time -Losartan 12.5 mg daily started by cardiology  Atrial fibrillation-recent diagnosis -Continue on metoprolol for  heart rate control and monitor on telemetry -Heparin drip for anticoagulation and hold Xarelto with plans for catheterization in a.m.  Hypertension-controlled -Recently taken off amlodipine -Continue on metoprolol for heart rate and blood pressure control  Dyslipidemia -Continue on statin  Obesity -Lifestyle changes  Ongoing tobacco abuse -Counseled on cessation   DVT prophylaxis: Heparin drip Code Status: Full code Family Communication: Husband at bedside Disposition Plan: Transfer to [facility] for anticipated cardiac catheterization by 4/27.  Patient to be on cardiology service thereafter.   Consultants:   Cardiology  Procedures:   None  Antimicrobials:   None   Subjective: Patient seen and evaluated today with no new acute complaints or concerns. No acute concerns or events noted overnight.  She denies any chest pain or shortness of breath.  Objective: Vitals:   12/02/19 0415 12/02/19 0650 12/02/19 0850 12/02/19 1050  BP: 121/70 127/80 129/82 124/84  Pulse: 63 63 64 70  Resp: 20 20 20 20   Temp: 98 F (36.7 C) 98.1 F (36.7 C) 98.2 F (36.8 C) 98.2 F (36.8 C)  TempSrc:      SpO2: 96% 99% 99% 99%  Weight:      Height:        Intake/Output Summary (Last 24 hours) at 12/02/2019 1124 Last data filed at 12/02/2019 0300 Gross per 24 hour  Intake 343.03 ml  Output -  Net 343.03 ml   Filed Weights   12/01/19 0847  Weight: 100.2 kg    Examination:  General exam: Appears calm and comfortable, obese Respiratory system: Clear to auscultation. Respiratory effort normal. Cardiovascular system: S1 & S2 heard, RRR. No JVD, murmurs, rubs,  gallops or clicks. No pedal edema. Gastrointestinal system: Abdomen is nondistended, soft and nontender. No organomegaly or masses felt. Normal bowel sounds heard. Central nervous system: Alert and oriented. No focal neurological deficits. Extremities: Symmetric 5 x 5 power. Skin: No rashes, lesions or ulcers  Psychiatry: Judgement and insight appear normal. Mood & affect appropriate.     Data Reviewed: I have personally reviewed following labs and imaging studies  CBC: Recent Labs  Lab 12/01/19 0857 12/02/19 0509  WBC 7.8 6.1  HGB 13.9 13.9  HCT 42.7 43.1  MCV 90.3 90.0  PLT 170 [id number]   Basic Metabolic Panel: Recent Labs  Lab 12/01/19 0857 12/02/19 0509  NA 139 139  K 4.0 3.8  CL 108 108  CO2 23 23  GLUCOSE 116* 109*  BUN 21* 14  CREATININE 0.89 0.60  CALCIUM 9.1 8.7*  MG  --  2.2   GFR: Estimated Creatinine Clearance: 97.2 mL/min (by C-G formula based on SCr of 0.6 mg/dL). Liver Function Tests: Recent Labs  Lab 12/01/19 0857  AST 18  ALT 15  ALKPHOS 82  BILITOT 0.4  PROT 7.1  ALBUMIN 3.9   No results for input(s): LIPASE, AMYLASE in the last 168 hours. No results for input(s): AMMONIA in the last 168 hours. Coagulation Profile: No results for input(s): INR, PROTIME in the last 168 hours. Cardiac Enzymes: No results for input(s): CKTOTAL, CKMB, CKMBINDEX, TROPONINI in the last 168 hours. BNP (last 3 results) No results for input(s): PROBNP in the last 8760 hours. HbA1C: Recent Labs    12/01/19 1459  HGBA1C 5.7*   CBG: No results for input(s): GLUCAP in the last 168 hours. Lipid Profile: Recent Labs    12/01/19 1459  CHOL 137  HDL 35*  LDLCALC 79  TRIG 115  CHOLHDL 3.9   Thyroid Function Tests: No results for input(s): TSH, T4TOTAL, FREET4, T3FREE, THYROIDAB in the last 72 hours. Anemia Panel: No results for input(s): VITAMINB12, FOLATE, FERRITIN, TIBC, IRON, RETICCTPCT in the last 72 hours. Sepsis Labs: No results for input(s): PROCALCITON, LATICACIDVEN in the last 168 hours.  Recent Results (from the past 240 hour(s))  Respiratory Panel by RT PCR (Flu A&B, Covid) - Nasopharyngeal Swab     Status: None   Collection Time: 12/01/19  6:05 PM   Specimen: Nasopharyngeal Swab  Result Value Ref Range Status   SARS Coronavirus 2 by RT PCR NEGATIVE  NEGATIVE Final    Comment: (NOTE) SARS-CoV-2 target nucleic acids are NOT DETECTED. The SARS-CoV-2 RNA is generally detectable in upper respiratoy specimens during the acute phase of infection. The lowest concentration of SARS-CoV-2 viral copies this assay can detect is 131 copies/mL. A negative result does not preclude SARS-Cov-2 infection and should not be used as the sole basis for treatment or other patient management decisions. A negative result may occur with  improper specimen collection/handling, submission of specimen other than nasopharyngeal swab, presence of viral mutation(s) within the areas targeted by this assay, and inadequate number of viral copies (<131 copies/mL). A negative result must be combined with clinical observations, patient history, and epidemiological information. The expected result is Negative. Fact Sheet for Patients:  PinkCheek.be Fact Sheet for Healthcare Providers:  GravelBags.it This test is not yet ap proved or cleared by the [place] FDA and  has been authorized for detection and/or diagnosis of SARS-CoV-2 by FDA under an Emergency Use Authorization (EUA). This EUA will remain  in effect (meaning this test can be used) for the duration of the  COVID-19 declaration under Section 564(b)(1) of the Act, 21 U.S.C. section 360bbb-3(b)(1), unless the authorization is terminated or revoked sooner.    Influenza A by PCR NEGATIVE NEGATIVE Final   Influenza B by PCR NEGATIVE NEGATIVE Final    Comment: (NOTE) The Xpert Xpress SARS-CoV-2/FLU/RSV assay is intended as an aid in  the diagnosis of influenza from Nasopharyngeal swab specimens and  should not be used as a sole basis for treatment. Nasal washings and  aspirates are unacceptable for Xpert Xpress SARS-CoV-2/FLU/RSV  testing. Fact Sheet for Patients: PinkCheek.be Fact Sheet for Healthcare Providers:  GravelBags.it This test is not yet approved or cleared by the [place] FDA and  has been authorized for detection and/or diagnosis of SARS-CoV-2 by  FDA under an Emergency Use Authorization (EUA). This EUA will remain  in effect (meaning this test can be used) for the duration of the  Covid-19 declaration under Section 564(b)(1) of the Act, 21  U.S.C. section 360bbb-3(b)(1), unless the authorization is  terminated or revoked. Performed at [facility], [address], [place], [place] [address]          Radiology Studies: DG Chest 2 View  Result Date: 12/01/2019 CLINICAL DATA:  Chest pain.  Shortness of breath. EXAM: CHEST - 2 VIEW COMPARISON:  August 10, 2011 FINDINGS: Stable mild cardiomegaly. The hila, mediastinum, lungs, and pleura are otherwise unremarkable. IMPRESSION: No active cardiopulmonary disease. Electronically Signed   By: Dorise Bullion III M.D   On: 12/01/2019 09:51        Scheduled Meds: . aspirin EC  81 mg Oral Daily  . losartan  12.5 mg Oral Daily  . metoprolol succinate  50 mg Oral Daily  . pravastatin  20 mg Oral Daily  . sodium chloride flush  3 mL Intravenous Q12H   Continuous Infusions: . sodium chloride    . heparin 1,200 Units/hr (12/02/19 0035)     LOS: 0 days    Time spent: 35 minutes    Ame Heagle D Manuella Ghazi, DO [facility]  If 7PM-7AM, please contact night-coverage [email or web address] 12/02/2019, 11:24 AM

## 2019-12-03 ENCOUNTER — Encounter (HOSPITAL_COMMUNITY)
Admission: EM | Disposition: A | Discharge status: Home / Self Care | Payer: Self-pay | Source: Home / Self Care | Attending: Internal Medicine

## 2019-12-03 DIAGNOSIS — R072 Precordial pain: Secondary | ICD-10-CM

## 2019-12-03 DIAGNOSIS — I428 Other cardiomyopathies: Secondary | ICD-10-CM

## 2019-12-03 DIAGNOSIS — E785 Hyperlipidemia, unspecified: Secondary | ICD-10-CM

## 2019-12-03 HISTORY — PX: RIGHT/LEFT HEART CATH AND CORONARY ANGIOGRAPHY: CATH118266

## 2019-12-03 LAB — POCT I-STAT 7, (LYTES, BLD GAS, ICA,H+H)
Acid-base deficit: 1 mmol/L (ref 0.0–2.0)
Bicarbonate: 24 mmol/L (ref 20.0–28.0)
Calcium, Ion: 1.2 mmol/L (ref 1.15–1.40)
HCT: 40 % (ref 36.0–46.0)
Hemoglobin: 13.6 g/dL (ref 12.0–15.0)
O2 Saturation: 98 %
Potassium: 3.9 mmol/L (ref 3.5–5.1)
Sodium: 142 mmol/L (ref 135–145)
TCO2: 25 mmol/L (ref 22–32)
pCO2 arterial: 42.2 mmHg (ref 32.0–48.0)
pH, Arterial: 7.363 (ref 7.350–7.450)
pO2, Arterial: 102 mmHg (ref 83.0–108.0)

## 2019-12-03 LAB — POCT I-STAT EG7
Acid-Base Excess: 0 mmol/L (ref 0.0–2.0)
Acid-Base Excess: 0 mmol/L (ref 0.0–2.0)
Bicarbonate: 26 mmol/L (ref 20.0–28.0)
Bicarbonate: 26.1 mmol/L (ref 20.0–28.0)
Calcium, Ion: 1.28 mmol/L (ref 1.15–1.40)
Calcium, Ion: 1.29 mmol/L (ref 1.15–1.40)
HCT: 42 % (ref 36.0–46.0)
HCT: 42 % (ref 36.0–46.0)
Hemoglobin: 14.3 g/dL (ref 12.0–15.0)
Hemoglobin: 14.3 g/dL (ref 12.0–15.0)
O2 Saturation: 69 %
O2 Saturation: 72 %
Potassium: 4.1 mmol/L (ref 3.5–5.1)
Potassium: 4.1 mmol/L (ref 3.5–5.1)
Sodium: 141 mmol/L (ref 135–145)
Sodium: 141 mmol/L (ref 135–145)
TCO2: 27 mmol/L (ref 22–32)
TCO2: 27 mmol/L (ref 22–32)
pCO2, Ven: 47.1 mmHg (ref 44.0–60.0)
pCO2, Ven: 47.7 mmHg (ref 44.0–60.0)
pH, Ven: 7.345 (ref 7.250–7.430)
pH, Ven: 7.351 (ref 7.250–7.430)
pO2, Ven: 38 mmHg (ref 32.0–45.0)
pO2, Ven: 41 mmHg (ref 32.0–45.0)

## 2019-12-03 LAB — CBC
HCT: 44.2 % (ref 36.0–46.0)
Hemoglobin: 14.4 g/dL (ref 12.0–15.0)
MCH: 29.1 pg (ref 26.0–34.0)
MCHC: 32.6 g/dL (ref 30.0–36.0)
MCV: 89.3 fL (ref 80.0–100.0)
Platelets: 186 10*3/uL (ref 150–400)
RBC: 4.95 MIL/uL (ref 3.87–5.11)
RDW: 13.2 % (ref 11.5–15.5)
WBC: 6.2 10*3/uL (ref 4.0–10.5)
nRBC: 0 % (ref 0.0–0.2)

## 2019-12-03 LAB — HEPARIN LEVEL (UNFRACTIONATED): Heparin Unfractionated: 0.58 IU/mL (ref 0.30–0.70)

## 2019-12-03 SURGERY — RIGHT/LEFT HEART CATH AND CORONARY ANGIOGRAPHY
Anesthesia: LOCAL

## 2019-12-03 MED ORDER — LOSARTAN POTASSIUM 25 MG PO TABS: 12.5000 mg | ORAL_TABLET | Freq: Every day | ORAL | 0 refills | Status: DC

## 2019-12-03 MED ORDER — HEPARIN (PORCINE) IN NACL 1000-0.9 UT/500ML-% IV SOLN
INTRAVENOUS | Status: AC
  Filled 2019-12-03: qty 1000

## 2019-12-03 MED ORDER — HEPARIN (PORCINE) IN NACL 1000-0.9 UT/500ML-% IV SOLN
INTRAVENOUS | Status: DC | PRN
  Administered 2019-12-03: 500 mL

## 2019-12-03 MED ORDER — ASPIRIN 81 MG PO CHEW
81.0000 mg | CHEWABLE_TABLET | ORAL | Status: DC
Start: 2019-12-04 — End: 2019-12-03

## 2019-12-03 MED ORDER — ONDANSETRON HCL 4 MG/2ML IJ SOLN: 4.0000 mg | Freq: Four times a day (QID) | INTRAMUSCULAR | Status: DC | PRN

## 2019-12-03 MED ORDER — IOHEXOL 350 MG/ML SOLN
INTRAVENOUS | Status: DC | PRN
  Administered 2019-12-03: 10:00:00 50 mL

## 2019-12-03 MED ORDER — SODIUM CHLORIDE 0.9% FLUSH: 3.0000 mL | Freq: Two times a day (BID) | INTRAVENOUS | Status: DC

## 2019-12-03 MED ORDER — ASPIRIN 81 MG PO CHEW: 81.0000 mg | CHEWABLE_TABLET | ORAL | Status: DC

## 2019-12-03 MED ORDER — HYDRALAZINE HCL 20 MG/ML IJ SOLN: 10.0000 mg | INTRAMUSCULAR | Status: DC | PRN

## 2019-12-03 MED ORDER — SODIUM CHLORIDE 0.9 % IV SOLN: 250.0000 mL | INTRAVENOUS | Status: DC | PRN

## 2019-12-03 MED ORDER — HEPARIN SODIUM (PORCINE) 1000 UNIT/ML IJ SOLN
INTRAMUSCULAR | Status: DC | PRN
  Administered 2019-12-03: 5000 [IU] via INTRAVENOUS

## 2019-12-03 MED ORDER — SODIUM CHLORIDE 0.9 % WEIGHT BASED INFUSION: 3.0000 mL/kg/h | INTRAVENOUS | Status: DC

## 2019-12-03 MED ORDER — MIDAZOLAM HCL 2 MG/2ML IJ SOLN
INTRAMUSCULAR | Status: AC
  Filled 2019-12-03: qty 2

## 2019-12-03 MED ORDER — VERAPAMIL HCL 2.5 MG/ML IV SOLN
INTRAVENOUS | Status: DC | PRN
  Administered 2019-12-03: 10 mL via INTRA_ARTERIAL

## 2019-12-03 MED ORDER — MIDAZOLAM HCL 2 MG/2ML IJ SOLN
INTRAMUSCULAR | Status: DC | PRN
  Administered 2019-12-03 (×2): 1 mg via INTRAVENOUS

## 2019-12-03 MED ORDER — SODIUM CHLORIDE 0.9 % IV SOLN: INTRAVENOUS | Status: AC

## 2019-12-03 MED ORDER — SODIUM CHLORIDE 0.9 % WEIGHT BASED INFUSION
1.0000 mL/kg/h | INTRAVENOUS | Status: DC
Start: 2019-12-04 — End: 2019-12-03

## 2019-12-03 MED ORDER — FENTANYL CITRATE (PF) 100 MCG/2ML IJ SOLN
INTRAMUSCULAR | Status: AC
  Filled 2019-12-03: qty 2

## 2019-12-03 MED ORDER — LIDOCAINE HCL (PF) 1 % IJ SOLN
INTRAMUSCULAR | Status: DC | PRN
  Administered 2019-12-03 (×2): 2 mL

## 2019-12-03 MED ORDER — LABETALOL HCL 5 MG/ML IV SOLN: 10.0000 mg | INTRAVENOUS | Status: DC | PRN

## 2019-12-03 MED ORDER — VERAPAMIL HCL 2.5 MG/ML IV SOLN
INTRAVENOUS | Status: AC
  Filled 2019-12-03: qty 2

## 2019-12-03 MED ORDER — HEPARIN SODIUM (PORCINE) 1000 UNIT/ML IJ SOLN
INTRAMUSCULAR | Status: AC
  Filled 2019-12-03: qty 1

## 2019-12-03 MED ORDER — ACETAMINOPHEN 325 MG PO TABS: 650.0000 mg | ORAL_TABLET | ORAL | Status: DC | PRN

## 2019-12-03 MED ORDER — FENTANYL CITRATE (PF) 100 MCG/2ML IJ SOLN
INTRAMUSCULAR | Status: DC | PRN
  Administered 2019-12-03: 25 ug via INTRAVENOUS

## 2019-12-03 MED ORDER — SODIUM CHLORIDE 0.9 % WEIGHT BASED INFUSION: 1.0000 mL/kg/h | INTRAVENOUS | Status: DC

## 2019-12-03 MED ORDER — SODIUM CHLORIDE 0.9 % IV SOLN
INTRAVENOUS | Status: AC | PRN
  Administered 2019-12-03: 10 mL/h via INTRAVENOUS

## 2019-12-03 MED ORDER — LIDOCAINE HCL (PF) 1 % IJ SOLN
INTRAMUSCULAR | Status: AC
  Filled 2019-12-03: qty 30

## 2019-12-03 MED ORDER — SODIUM CHLORIDE 0.9% FLUSH: 3.0000 mL | INTRAVENOUS | Status: DC | PRN

## 2019-12-03 MED ORDER — SODIUM CHLORIDE 0.9 % WEIGHT BASED INFUSION: 3.0000 mL/kg/h | INTRAVENOUS | Status: AC

## 2019-12-03 SURGICAL SUPPLY — 12 items
CATH 5FR JL3.5 JR4 ANG PIG MP (CATHETERS) ×1 IMPLANT
CATH BALLN WEDGE 5F 110CM (CATHETERS) ×1 IMPLANT
DEVICE RAD COMP TR BAND LRG (VASCULAR PRODUCTS) ×1 IMPLANT
GLIDESHEATH SLEND SS 6F .021 (SHEATH) ×1 IMPLANT
GUIDEWIRE INQWIRE 1.5J.035X260 (WIRE) IMPLANT
INQWIRE 1.5J .035X260CM (WIRE) ×2
KIT HEART LEFT (KITS) ×2 IMPLANT
PACK CARDIAC CATHETERIZATION (CUSTOM PROCEDURE TRAY) ×2 IMPLANT
SHEATH GLIDE SLENDER 4/5FR (SHEATH) ×1 IMPLANT
SHEATH PROBE COVER 6X72 (BAG) ×1 IMPLANT
TRANSDUCER W/STOPCOCK (MISCELLANEOUS) ×2 IMPLANT
TUBING CIL FLEX 10 FLL-RA (TUBING) ×2 IMPLANT

## 2019-12-03 NOTE — Progress Notes (Signed)
TR BAND REMOVAL  LOCATION:    right radial  DEFLATED PER PROTOCOL:    Yes.    TIME BAND OFF / DRESSING APPLIED:    1145   SITE UPON ARRIVAL:    Level 0  SITE AFTER BAND REMOVAL:    Level 0  CIRCULATION SENSATION AND MOVEMENT:    Within Normal Limits   Yes.    COMMENTS:   Removed by Beverly Gust, RN

## 2019-12-03 NOTE — Discharge Summary (Addendum)
Discharge Summary    Patient ID: Jade Bradley,  MRN: [id number], DOB/AGE: 09/25/1960 59 y.o.  Admit date: 12/01/2019 Discharge date: 12/03/2019  Primary Care Provider: Sharilyn Bradley Primary Cardiologist: Jade Sable, MD  Discharge Diagnoses    Principal Problem:   Angina pectoris, unspecified [facility]) Active Problems:   Paroxysmal atrial fibrillation (HCC)   Chest pain   NICM (nonischemic cardiomyopathy) ([place])   Hyperlipidemia  Allergies No Known Allergies  Diagnostic Studies/Procedures    Cath: 12/03/19  1. No angiographic evidence of CAD 2. Normal right and left heart pressures.    Recommendations: No further ischemic workup. Resume Xarelto tomorrow. OK to discharge home today.  _____________   History of Present Illness     Jade Bradley 59 yo female history of fairly new diagnosis of afib seen by Jade Bradley as outpatient in 10/2019, cardioversion considered by not pursued after echo showed severe LAE. Also history of tobacco abuse, HL, HTN,  new diagnosis of systolic dysfunction by [id number] echo LVEF 35-40%.  She presented to Jade Bradley, ED with chest pain. Episode occurred while at work at [employer].  Reported 9/10 pressure midchest with significant diaphoresis and SOB. Sat down with some improvement in symptoms after 15-20 minutes. Came to ER, ongoing mild symptoms that resolved with SL NG. No recurrent pain. CAD risk factors: HTN, tobacco abuse x 44 years, HL   Labs in the ED showed: K 4 Cr 0.89 WBC 7.8 Hgb 13.9 Plt 170 LDL 79  hstrop 3-->3-->5 EKG afib elevated rates, nonspecific ST/T changes COVID neg  CXR no acute process 11/2019 echo LVE 35-40%, global hypokinesis, normal RV, severe LAE  Given symptoms cardiology was consulted and she was seen by Jade Bradley while at [facility].  Maintained on IV heparin prior to transfer to [facility] for cardiac cath.  Hospital Course     Cardiac enzymes remained negative.  She was continued on her home metoprolol and  started on losartan 12.5 mg.  Underwent cardiac cath noted above with Jade Bradley with no significant CAD noted. Will continue with losartan at discharge. Consider switching to [facility] at follow up appt. No complications noted post cath. Instructions/precautions post cath given prior to discharge. Radial cath site stable at discharge. Plan to resume Xarelto the following morning.   Jade Bradley was seen by Jade Bradley and determined stable for discharge home. Follow up in the office has been arranged. Medications are listed below.   _____________  Discharge Vitals Blood pressure 115/79, pulse (!) 50, temperature 98 F (36.7 C), temperature source Oral, resp. rate (!) 9, height 5\' 8"  (1.727 m), weight 100.2 kg, last menstrual period 08/11/2011, SpO2 98 %.  Filed Weights   12/01/19 0847  Weight: 100.2 kg    Labs & Radiologic Studies    CBC Recent Labs    12/02/19 0509 12/03/19 0535  WBC 6.1 6.2  HGB 13.9 14.4  HCT 43.1 44.2  MCV 90.0 89.3  PLT 165 [id number]   Basic Metabolic Panel Recent Labs    12/01/19 0857 12/02/19 0509  Bradley 139 139  K 4.0 3.8  CL 108 108  CO2 23 23  GLUCOSE 116* 109*  BUN 21* 14  CREATININE 0.89 0.60  CALCIUM 9.1 8.7*  MG  --  2.2   Liver Function Tests Recent Labs    12/01/19 0857  AST 18  ALT 15  ALKPHOS 82  BILITOT 0.4  PROT 7.1  ALBUMIN 3.9   No results for input(s): LIPASE, AMYLASE  in the last 72 hours. Cardiac Enzymes No results for input(s): CKTOTAL, CKMB, CKMBINDEX, TROPONINI in the last 72 hours. BNP Invalid input(s): POCBNP D-Dimer No results for input(s): DDIMER in the last 72 hours. Hemoglobin A1C Recent Labs    12/01/19 1459  HGBA1C 5.7*   Fasting Lipid Panel Recent Labs    12/01/19 1459  CHOL 137  HDL 35*  LDLCALC 79  TRIG 115  CHOLHDL 3.9   Thyroid Function Tests No results for input(s): TSH, T4TOTAL, T3FREE, THYROIDAB in the last 72 hours.  Invalid input(s): FREET3 _____________  DG Chest 2  View  Result Date: 12/01/2019 CLINICAL DATA:  Chest pain.  Shortness of breath. EXAM: CHEST - 2 VIEW COMPARISON:  August 10, 2011 FINDINGS: Stable mild cardiomegaly. The hila, mediastinum, lungs, and pleura are otherwise unremarkable. IMPRESSION: No active cardiopulmonary disease. Electronically Signed   By: Jade Bradley M.D   On: 12/01/2019 09:51   CARDIAC CATHETERIZATION  Result Date: 12/03/2019 1. No angiographic evidence of CAD 2. Normal right and left heart pressures. Recommendations: No further ischemic workup. Resume Xarelto tomorrow. OK to discharge home today.   ECHOCARDIOGRAM COMPLETE  Result Date: 11/21/2019    ECHOCARDIOGRAM REPORT   Patient Name:   Jade Bradley Date of Exam: 11/21/2019 Medical Rec #:  [id number]          Height:       67.0 in Accession #:    [id number]         Weight:       217.5 lb Date of Birth:  1961/03/10           BSA:          2.096 m Patient Age:    26 years           BP:           134/85 mmHg Patient Gender: F                  HR:           85 bpm. Exam Location:  [place] Procedure: 2D Echo, Cardiac Doppler and Color Doppler Indications:     I48.91 New onset otrial fibrillation  History:         Patient has no prior history of Echocardiogram examinations.                  Abnormal ECG, Arrythmias:Atrial Fibrillation and tachycardia,                  Signs/Symptoms:Chest Pain and Dizziness/Lightheadedness; Risk                  Factors:Hypertension, Current Smoker, Sleep Apnea and Morbidly                  obese.  Sonographer:     Jade Bradley RDMS, RVT, RDCS Referring Phys:  Jade Bradley Diagnosing Phys: Jade Dolly MD  Sonographer Comments: Image acquisition challenging due to patient body habitus. IMPRESSIONS  1. Global hypokinesis. More severe hypokinesis of the anteroseptal wall. . Left ventricular ejection fraction, by estimation, is 35 to 40%. The left ventricle has moderately decreased function. The left ventricle demonstrates global  hypokinesis. The left ventricular internal cavity size was mildly dilated. Left ventricular diastolic parameters are indeterminate.  2. Right ventricular systolic function is normal. The right ventricular size is normal. There is normal pulmonary artery systolic pressure.  3. Left atrial size was severely dilated.  4. Right  atrial size was mildly dilated.  5. The mitral valve is normal in structure. Trivial mitral valve regurgitation. No evidence of mitral stenosis.  6. The aortic valve is tricuspid. Aortic valve regurgitation is not visualized. No aortic stenosis is present.  7. The inferior vena cava is normal in size with greater than 50% respiratory variability, suggesting right atrial pressure of 3 mmHg.  8. Probably small PFO with mild left to right shunt. FINDINGS  Left Ventricle: Global hypokinesis. More severe hypokinesis of the anteroseptal wall. Left ventricular ejection fraction, by estimation, is 35 to 40%. The left ventricle has moderately decreased function. The left ventricle demonstrates global hypokinesis. The left ventricular internal cavity size was mildly dilated. There is no left ventricular hypertrophy. Left ventricular diastolic parameters are indeterminate. Right Ventricle: The right ventricular size is normal. No increase in right ventricular wall thickness. Right ventricular systolic function is normal. There is normal pulmonary artery systolic pressure. The tricuspid regurgitant velocity is 1.38 m/s, and  with an assumed right atrial pressure of 10 mmHg, the estimated right ventricular systolic pressure is [id number] mmHg. Left Atrium: Left atrial size was severely dilated. Right Atrium: Right atrial size was mildly dilated. Pericardium: There is no evidence of pericardial effusion. Mitral Valve: The mitral valve is normal in structure. Trivial mitral valve regurgitation. No evidence of mitral valve stenosis. Tricuspid Valve: The tricuspid valve is not well visualized. Tricuspid valve  regurgitation is not demonstrated. No evidence of tricuspid stenosis. Aortic Valve: The aortic valve is tricuspid. Aortic valve regurgitation is not visualized. No aortic stenosis is present. Pulmonic Valve: The pulmonic valve was not well visualized. Pulmonic valve regurgitation is not visualized. No evidence of pulmonic stenosis. Aorta: The aortic root is normal in size and structure. Pulmonary Artery: Indeterminate PASP, inadequate TR jet. Venous: The inferior vena cava is normal in size with greater than 50% respiratory variability, suggesting right atrial pressure of 3 mmHg. IAS/Shunts: Probably small PFO with mild left to right shunt.  LEFT VENTRICLE PLAX 2D LVIDd:         6.01 cm      Diastology LVIDs:         4.96 cm      LV e' lateral:   11.37 cm/s LV PW:         1.11 cm      LV E/e' lateral: 8.5 LV IVS:        0.93 cm      LV e' medial:    8.41 cm/s LVOT diam:     2.00 cm      LV E/e' medial:  11.4 LV SV:         56 LV SV Index:   27 LVOT Area:     3.14 cm  LV Volumes (MOD) LV vol d, MOD A2C: 132.0 ml LV vol d, MOD A4C: 142.0 ml LV vol s, MOD A2C: 83.8 ml LV vol s, MOD A4C: 101.2 ml LV SV MOD A2C:     48.2 ml LV SV MOD A4C:     142.0 ml LV SV MOD BP:      46.6 ml RIGHT VENTRICLE RV S prime:     10.77 cm/s TAPSE (M-mode): 2.5 cm LEFT ATRIUM              Index LA diam:        4.10 cm  1.96 cm/m LA Vol (A2C):   57.4 ml  27.39 ml/m LA Vol (A4C):   115.0 ml 54.88 ml/m LA Biplane  Vol:          40.70 ml/m  AORTIC VALVE LVOT Vmax:   86.73 cm/s LVOT Vmean:  62.900 cm/s LVOT VTI:    0.179 m  AORTA Ao Root diam: 3.80 cm MITRAL VALVE               TRICUSPID VALVE MV Area (PHT):             TR Peak grad:   7.6 mmHg MV Decel Time: 152 msec    TR Vmax:        138.00 cm/s MR Peak grad: 43.0 mmHg MR Vmax:      328.00 cm/s  SHUNTS MV E velocity: 96.23 cm/s  Systemic VTI:  0.18 m MV A velocity: 27.27 cm/s  Systemic Diam: 2.00 cm MV E/A ratio:  3.53 Jade Dolly MD Electronically signed by Jade Dolly MD Signature  Date/Time: 11/21/2019/1:34:10 PM    Final    Disposition   Pt is being discharged home today in good condition.  Follow-up Plans & Appointments    Follow-up Information     Verta Ellen., NP Follow up on 12/18/2019.   Specialty: Cardiology Why: at 10:30am for your follow up appt.  Contact information: [place] [address] [phone]             Discharge Medications     Medication List     TAKE these medications    losartan 25 MG tablet Commonly known as: COZAAR Take 0.5 tablets (12.5 mg total) by mouth daily.   metoprolol succinate 50 MG 24 hr tablet Commonly known as: TOPROL-XL Take 1 tablet (50 mg total) by mouth daily.   pravastatin 20 MG tablet Commonly known as: PRAVACHOL Take 20 mg by mouth daily.   rivaroxaban 20 MG Tabs tablet Commonly known as: XARELTO Take 1 tablet (20 mg total) by mouth daily with supper.         No                               Did the patient have a percutaneous coronary intervention (stent / angioplasty)?:  No.      Outstanding Labs/Studies   N/a   Duration of Discharge Encounter   Greater than 30 minutes including physician time.  Signed, Reino Bellis NP-C 12/03/2019, 10:08 AM  I have personally seen and examined this patient. I agree with the assessment and plan as outlined above.  Cardiac cath with no CAD. Normal filling pressures. D/C home today. Resume Xarelto tomorrow. F/U with Jade. Bronson Bradley.   Lauree Chandler 12/03/2019 10:14 AM

## 2019-12-03 NOTE — Interval H&P Note (Signed)
History and Physical Interval Note:  12/03/2019 8:55 AM  Jade Bradley  has presented today for surgery, with the diagnosis of chest pain.  The various methods of treatment have been discussed with the patient and family. After consideration of risks, benefits and other options for treatment, the patient has consented to  Procedure(s): RIGHT/LEFT HEART CATH AND CORONARY ANGIOGRAPHY (N/A) as a surgical intervention.  The patient's history has been reviewed, patient examined, no change in status, stable for surgery.  I have reviewed the patient's chart and labs.  Questions were answered to the patient's satisfaction.    Cath Lab Visit (complete for each Cath Lab visit)  Clinical Evaluation Leading to the Procedure:   ACS: No.  Non-ACS:    Anginal Classification: CCS III  Anti-ischemic medical therapy: Minimal Therapy (1 class of medications)  Non-Invasive Test Results: No non-invasive testing performed  Prior CABG: No previous CABG        Lauree Chandler

## 2019-12-03 NOTE — Progress Notes (Signed)
ANTICOAGULATION CONSULT NOTE  Pharmacy Consult for heparin dosing Indication:  ACS/STEMI/afib  No Known Allergies  Patient Measurements: Height: 5\' 8"  (172.7 cm) Weight: 100.2 kg (221 lb) IBW/kg (Calculated) : 63.9 Heparin Dosing Weight: HEPARIN DW (KG): 86  Vital Signs: Temp: 98 F (36.7 C) (04/27 0504) Temp Source: Oral (04/27 0504) BP: 121/91 (04/27 0504) Pulse Rate: 99 (04/27 0504)  Labs: Recent Labs     0000 12/01/19 0857 12/01/19 1024 12/01/19 1459 12/01/19 1459 12/01/19 1912 12/01/19 2332 12/02/19 0509 12/02/19 0854 12/02/19 1059 12/03/19 0535  HGB   < > 13.9  --   --   --   --   --  13.9  --   --  14.4  HCT  --  42.7  --   --   --   --   --  43.1  --   --  44.2  PLT  --  170  --   --   --   --   --  165  --   --  186  APTT  --   --   --   --   --    < > 64* 80* 89*  --   --   HEPARINUNFRC  --   --   --  1.00*   < >  --   --  0.50  --  0.49 0.58  CREATININE  --  0.89  --   --   --   --   --  0.60  --   --   --   TROPONINIHS  --  3 3 5   --   --   --   --   --   --   --    < > = values in this interval not displayed.    Estimated Creatinine Clearance: 97.2 mL/min (by C-G formula based on SCr of 0.6 mg/dL).   Medical History: Past Medical History:  Diagnosis Date  . Anxiety   . Atrial fibrillation ([place])   . Hypercholesteremia   . Seasonal allergies     Medications:  Patient was taking rivaroxaban 20mg  daily for newly-diagnosed atrial fibrillation. Her last dose was on 11-30-19 at 1500.  Assessment: Pharmacy consulted to dose heparin infusion for this 52 yof  with chest pain.   Initial two sets of troponins are negative and EKG shows AF. CBC is WNL and CXR shows no acute findings.  Plan for catheterization today.   HL: 0.58 IU/mL- therapeutic CBC: remains WNL  Goal of Therapy:  Heparin level 0.3-0.7 units/ml  Monitor platelets by anticoagulation protocol: Yes   Plan:  Continue  heparin infusion at 1200 units/hr Daily heparin level and  CBC Monitor signs and symptoms of bleeding  Margot Ables, PharmD Clinical Pharmacist 12/03/2019 7:40 AM

## 2019-12-03 NOTE — Discharge Instructions (Signed)
DRINK PLENTY OF FLUIDS FOR THE NEXT 2-3 DAYS.  KEEP ARM ELEVATED THE REMAINDER OF THE DAY.  Radial Site Care  This sheet gives you information about how to care for yourself after your procedure. Your health care provider may also give you more specific instructions. If you have problems or questions, contact your health care provider. What can I expect after the procedure? After the procedure, it is common to have:  Bruising and tenderness at the catheter insertion area. Follow these instructions at home: Medicines  Take over-the-counter and prescription medicines only as told by your health care provider. Insertion site care 1. Follow instructions from your health care provider about how to take care of your insertion site. Make sure you: ? Wash your hands with soap and water before you change your bandage (dressing). If soap and water are not available, use hand sanitizer. ? Change your dressing as told by your health care provider. 2. Check your insertion site every day for signs of infection. Check for: ? Redness, swelling, or pain. ? Fluid or blood. ? Pus or a bad smell. ? Warmth. 3. Do not take baths, swim, or use a hot tub for 5 days. 4. You may shower 24-48 hours after the procedure. ? Remove the dressing and gently wash the site with plain soap and water. ? Pat the area dry with a clean towel. ? Do not rub the site. That could cause bleeding. 5. Do not apply powder or lotion to the site. Activity  1. For 24 hours after the procedure, or as directed by your health care provider: ? Do not flex or bend the affected arm. ? Do not push or pull heavy objects with the affected arm. ? Do not drive yourself home from the hospital or clinic. You may drive 24 hours after the procedure. ? Do not operate machinery or power tools. 2. Do not push, pull or lift anything that is heavier than 10 lb for 5 days. 3. Ask your health care provider when it is okay to: ? Return to work or  school. ? Resume usual physical activities or sports. ? Resume sexual activity. General instructions  If the catheter site starts to bleed, raise your arm and put firm pressure on the site. If the bleeding does not stop, get help right away. This is a medical emergency.  If you went home on the same day as your procedure, a responsible adult should be with you for the first 24 hours after you arrive home.  Keep all follow-up visits as told by your health care provider. This is important. Contact a health care provider if:  You have a fever.  You have redness, swelling, or yellow drainage around your insertion site. Get help right away if:  You have unusual pain at the radial site.  The catheter insertion area swells very fast.  The insertion area is bleeding, and the bleeding does not stop when you hold steady pressure on the area.  Your arm or hand becomes pale, cool, tingly, or numb. These symptoms may represent a serious problem that is an emergency. Do not wait to see if the symptoms will go away. Get medical help right away. Call your local emergency services (911 in the [place] Do not drive yourself to the hospital. Summary  After the procedure, it is common to have bruising and tenderness at the site.  Follow instructions from your health care provider about how to take care of your radial site wound. Check   the wound every day for signs of infection.  Do not push, pull or lift anything that is heavier than 10 lb for 5 days.  This information is not intended to replace advice given to you by your health care provider. Make sure you discuss any questions you have with your health care provider. Document Revised: 08/30/2017 Document Reviewed: 08/30/2017 Elsevier Patient Education  2020 [employer] 

## 2019-12-04 ENCOUNTER — Encounter: Payer: Self-pay | Admitting: *Deleted

## 2019-12-04 ENCOUNTER — Telehealth: Payer: Self-pay | Admitting: Cardiovascular Disease

## 2019-12-04 NOTE — Telephone Encounter (Signed)
Needs note for work stating when she can go back to work.

## 2019-12-04 NOTE — Telephone Encounter (Signed)
Please advise - no cad on cath.

## 2019-12-04 NOTE — Telephone Encounter (Signed)
Patient notified.  She will have her husband Hedy Camara) pick up tomorrow.

## 2019-12-04 NOTE — Telephone Encounter (Signed)
Would provide a letter advising no more than 40 hrs per week. I agree; the less stress the better for her.

## 2019-12-04 NOTE — Telephone Encounter (Signed)
Can return to work now. Need to keep eye on HR to make sure it's adequately controlled.

## 2019-12-04 NOTE — Telephone Encounter (Signed)
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  Typically had been working 65 - 70 hours per week / 10 hour days.  Very stressful.  Does not feel like she can go back to that at full capacity yet.  Post cath visit is scheduled for 12/18/2019 with Jonni Sanger.  Please advise.

## 2019-12-11 ENCOUNTER — Ambulatory Visit (INDEPENDENT_AMBULATORY_CARE_PROVIDER_SITE_OTHER): Payer: Managed Care, Other (non HMO) | Admitting: Neurology

## 2019-12-11 DIAGNOSIS — G4733 Obstructive sleep apnea (adult) (pediatric): Secondary | ICD-10-CM | POA: Diagnosis not present

## 2019-12-11 DIAGNOSIS — I480 Paroxysmal atrial fibrillation: Secondary | ICD-10-CM

## 2019-12-11 DIAGNOSIS — I428 Other cardiomyopathies: Secondary | ICD-10-CM

## 2019-12-11 DIAGNOSIS — R0683 Snoring: Secondary | ICD-10-CM

## 2019-12-11 DIAGNOSIS — F17200 Nicotine dependence, unspecified, uncomplicated: Secondary | ICD-10-CM

## 2019-12-17 DIAGNOSIS — F17200 Nicotine dependence, unspecified, uncomplicated: Secondary | ICD-10-CM | POA: Insufficient documentation

## 2019-12-17 NOTE — Addendum Note (Signed)
Addended by: Larey Seat on: 12/17/2019 04:38 PM   Modules accepted: Orders

## 2019-12-17 NOTE — Progress Notes (Signed)
There was very mild OSA noted, at AHI of 6.9/h and in REM sleep  AHI was 12/h.  Loud snoring was recorded, primarily in supine position. There  were many intermittent short periods of hypoxemia, a heart rhythm  however cannot be documented on HST.   Recommendations:    This REM dependent mild apnea is associated with upper airway  resistance syndrome (UARS) and intermittent hypoxia. I would  recommend CPAP treatment based on the underlying atrial fib  condition.  Auto CPAP device will be set for a window of 6-15 cm water  pressure, heated humidity, and 2 cm EPR, with a mask of patient's  comfort and choice.   Interpreting Physician: Larey Seat, MD

## 2019-12-17 NOTE — Progress Notes (Signed)
Cardiology Office Note  Date: 12/18/2019   ID: Jade Bradley, DOB [birth year], MRN [id number]  PCP:  Sharilyn Sites, Bradley  Cardiologist:  Kate Sable, Bradley Electrophysiologist:  None   Chief Complaint: Follow-up atrial fibrillation with RVR, HTN, sleep disordered breathing, cardiomyopathy (nonischemic)  History of Present Illness: Jade Bradley is a 59 y.o. female with a history of  atrial fibrillation with RVR, HTN, sleep disordered breathing  Initially seen by Dr. Bronson Ing in consult for new onset atrial fibrillation.  She was started on Toprol-XL and Xarelto.  She had been experiencing palpitations for 4 months prior to presenting to her PCP.  In addition was having episodic chest pressure both at rest and occasionally with exertion.  She complained of leg swelling when standing on her feet for long periods of time.  Occasional dizziness but no syncopal episodes.  She complained of periodic headaches.  Husband confirmed she snored a lot and there were no witnessed apneic episodes.  No sleep study referral was ordered.  Her Toprol was increased to 50 mg daily.  An echocardiogram was ordered to evaluate cardiac structure function and left atrial size. Echocardiogram showed EF of 35 to 40%, global hypokinesis with more severe hypokinesis of anterior septal wall, left atrial size severely dilated, right atrial size mildly dilated, trivial MR, probably small PFO with mild left-to-right shunt  Dr. Bronson Ing discussed possibility of DC cardioversion with patient if she remained symptomatic in spite of adequate heart rate control.  Presented to [facility] emergency room on 12/01/2019 with chest pain.  Episode occurred at work.  She reported 9 out of 10 pressure mid chest with significant diaphoresis and shortness of breath sit down with improvement in symptoms after 15 to 20 minutes.  Presented to emergency room with ongoing mild symptoms which resolved with sublingual nitroglycerin.   High-sensitivity troponins were 3/3/5.  Left and right heart cath showed normal coronary arteries.  Discharge she was continued on her home metoprolol and started losartan 12.5 mg.  There was mention of possibly switching to [facility] at follow-up appointment.  Today she presents without any significant complaints.  She denies any anginal or exertional symptoms, orthostatic symptoms, PND or orthopnea, bleeding issues, or lower extremity edema.  Discussed sleep study results with patient.  Plans are to place her on CPAP.  Patient states she feels occasional palpitations.  She states her electronic watch measured her heart rate at around 150 recently.  She states she sat down and rested and heart rate returned back to to the 80s.  We reviewed the echo results and discussed possible contributions/causes of decreased EF.  Patient admits that she had been experiencing palpitations up to 6 months prior to discovery of her new onset atrial fibrillation diagnosis.  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  She continues to work at the same location but her work status is changed and husband states she he has noticed her stress levels have significantly decreased.  Past Medical History:  Diagnosis Date  . Anxiety   . Atrial fibrillation ([place])   . Hypercholesteremia   . Seasonal allergies     Past Surgical History:  Procedure Laterality Date  . CHOLECYSTECTOMY    . COLONOSCOPY N/A 04/07/2014   Procedure: COLONOSCOPY;  Surgeon: Jade Bradley;  Location: [facility];  Service: Endoscopy;  Laterality: N/A;  8:30 AM  . RIGHT/LEFT HEART CATH AND CORONARY ANGIOGRAPHY N/A 12/03/2019   Procedure: RIGHT/LEFT HEART  CATH AND CORONARY ANGIOGRAPHY;  Surgeon: Jade Bradley;  Location: [facility] LAB;  Service: Cardiovascular;  Laterality: N/A;    Current Outpatient Medications  Medication Sig Dispense Refill  . losartan (COZAAR) 25 MG tablet Take 0.5 tablets (12.5 mg total)  by mouth daily. 30 tablet 0  . metoprolol succinate (TOPROL-XL) 50 MG 24 hr tablet Take 1 tablet (50 mg total) by mouth daily. 30 tablet 6  . pravastatin (PRAVACHOL) 20 MG tablet Take 20 mg by mouth daily.    . rivaroxaban (XARELTO) 20 MG TABS tablet Take 1 tablet (20 mg total) by mouth daily with supper. 28 tablet 0   No current facility-administered medications for this visit.   Allergies:  Patient has no known allergies.   Social History: The patient  reports that she has been smoking cigarettes. She started smoking about 44 years ago. She has smoked for the past 48.00 years. She has never used smokeless tobacco. She reports that she does not drink alcohol or use drugs.   Family History: The patient's family history includes CAD in her father and mother; Diabetes in her father; Heart attack in her father and mother; Hypertension in her father.   ROS:  Please see the history of present illness. Otherwise, complete review of systems is positive for none.  All other systems are reviewed and negative.   Physical Exam: VS:  BP 138/84   Pulse 84   Ht 5\' 8"  (1.727 m)   Wt 223 lb (101.2 kg)   LMP 08/11/2011   SpO2 98%   BMI 33.91 kg/m , BMI Body mass index is 33.91 kg/m.  Wt Readings from Last 3 Encounters:  12/18/19 223 lb (101.2 kg)  12/01/19 221 lb (100.2 kg)  11/19/19 217 lb 8 oz (98.7 kg)    General: Patient appears comfortable at rest. Neck: Supple, no elevated JVP or carotid bruits, no thyromegaly. Lungs: Clear to auscultation, nonlabored breathing at rest. Cardiac: Irregularly irregular rate and rhythm, no S3 or significant systolic murmur, no pericardial rub. Extremities: No pitting edema, distal pulses 2+. Skin: Warm and dry. Musculoskeletal: No kyphosis. Neuropsychiatric: Alert and oriented x3, affect grossly appropriate.  ECG:  Last recorded EKG performed at [facility] emergency room on 12/02/2019 patient was in atrial fibrillation with a rate of 116.  Recent  Labwork: 12/01/2019: ALT 15; AST 18 12/02/2019: BUN 14; Creatinine, Ser 0.60; Magnesium 2.2 12/03/2019: Hemoglobin 14.3; Platelets 186; Potassium 4.1; Sodium 141     Component Value Date/Time   CHOL 137 12/01/2019 1459   TRIG 115 12/01/2019 1459   HDL 35 (L) 12/01/2019 1459   CHOLHDL 3.9 12/01/2019 1459   VLDL 23 12/01/2019 1459   [place] 79 12/01/2019 1459    Other Studies Reviewed Today:  Sleep study 12/11/2019 There was very mild OSA noted, at AHI of 6.9/h and in REM sleep  AHI was 12/h.  Loud snoring was recorded, primarily in supine position. There  were many intermittent short periods of hypoxemia, a heart rhythm  however cannot be documented on HST.   Recommendations:    This REM dependent mild apnea is associated with upper airway  resistance syndrome (UARS) and intermittent hypoxia. I would  recommend CPAP treatment based on the underlying atrial fib  condition.  Auto CPAP device will be set for a window of 6-15 cm water  pressure, heated humidity, and 2 cm EPR, with a mask of patient's  comfort and choice.   There was very mild OSA noted, at  AHI of 6.9/h and in REM sleep AHI was 12/h.Loud snoring was recorded, primarily in supine position. There were many intermittent short periods of hypoxemia, a heart rhythm however cannot be documented on HST.    LHC/RHC 12/03/2019 1. No angiographic evidence of CAD 2. Normal right and left heart pressures.  Recommendations: No further ischemic workup. Resume Xarelto tomorrow. OK to discharge home today.    Echocardiogram 11/21/2019 1. Global hypokinesis. More severe hypokinesis of the anteroseptal wall.  . Left ventricular ejection fraction, by estimation, is 35 to 40%. The  left ventricle has moderately decreased function. The left ventricle  demonstrates global hypokinesis. The  left ventricular internal cavity size was mildly dilated. Left ventricular  diastolic parameters are indeterminate.  2. Right ventricular  systolic function is normal. The right ventricular  size is normal. There is normal pulmonary artery systolic pressure.  3. Left atrial size was severely dilated.  4. Right atrial size was mildly dilated.  5. The mitral valve is normal in structure. Trivial mitral valve  regurgitation. No evidence of mitral stenosis.  6. The aortic valve is tricuspid. Aortic valve regurgitation is not  visualized. No aortic stenosis is present.  7. The inferior vena cava is normal in size with greater than 50%  respiratory variability, suggesting right atrial pressure of 3 mmHg.  8. Probably small PFO with mild left to right shunt.   Assessment and Plan:  1. New onset atrial fibrillation ([place])   2. Essential hypertension   3. NICM (nonischemic cardiomyopathy) ([place])   4. Mixed hyperlipidemia    1. New onset atrial fibrillation ([facility]) Patient's heart rate is 84 today on exam irregularly irregular.  Continue Toprol-XL 50 mg daily.  Continue Xarelto 20 mg daily.  Informed patient Dr. Bronson Ing would discuss possible DCCV if warranted at next visit.  2. Essential hypertension Blood pressure today is 138/84.  Will increase losartan to 25 mg daily.   3. NICM (nonischemic cardiomyopathy) ([facility]) EF for 35 to 40%, global hypokinesis more severe of anteroseptal wall.  Left ventricular internal cavity size is mildly dilated.  Increase losartan to 25 mg p.o. today.  Continue Toprol-XL 50 mg daily.  Possible decreased EF could be related to sustained atrial fibrillation before recent diagnosis of A. fib with RVR.  Rate is now controlled.  May need to perform a repeat echo in the next few months to see if there is improvement with rate/rhythm control of atrial fibrillation.  4. Mixed hyperlipidemia  Recent lipid profile showed a total cholesterol 137, triglycerides 115, HDL 35, LDL 79.  Continue pravastatin 20 mg daily  Medication Adjustments/Labs and Tests Ordered: Current medicines are reviewed at length with  the patient today.  Concerns regarding medicines are outlined above.   Disposition: Follow-up with Dr Bronson Ing at prescheduled appointment in June  Signed, Avina Eberle Quinn, NP 12/18/2019 10:23 AM    [facility] [address], [place], [place] [address] Phone: [phone]; Fax: [phone]

## 2019-12-17 NOTE — Procedures (Signed)
  Patient Information     First Name: Jade Last Name: Bradley Keplar: [id number]  Birth Date: 10/14/1960 Age: 59 Gender: Female  Referring Provider: Herminio Commons, MD BMI: 34.3 (W=218 lb, H=5' 7'')  Neck Circ.:  16 '' Epworth:  4/24   Sleep Study Information    Study Date: Dec 11, 2019 S/H/A Version: 001.001.001.001 / 4.0.1515 / 37  History:    Milta Deiters a right -handed Caucasian female with a past medical history of Anxiety, Atrial fibrillation ([place]), Hypercholesteremia, Seasonal allergies, Obesity and tobacco use. The patient was referred for a sleep study exploring the possibility of OSA contributing to atrial fibrillation.        Summary & Diagnosis:     There was very mild OSA noted, at AHI of 6.9/h and in REM sleep AHI was 12/h. Loud snoring was recorded, primarily in supine position. There were many intermittent short periods of hypoxemia, a heart rhythm however cannot be documented on HST.   Recommendations:     This REM dependent mild apnea is associated with upper airway resistance syndrome (UARS) and intermittent hypoxia. I would recommend CPAP treatment based on the underlying atrial fib condition.  Auto CPAP device will be set for a window of 6-15 cm water pressure, heated humidity, and 2 cm EPR, with a mask of patient's comfort and choice.   Interpreting Physician: Larey Seat, MD            Sleep Summary  Oxygen Saturation Statistics   Start Study Time: End Study Time: Total Recording Time:  8:29:29 PM 4:48:28 AM 8 h, 18 min  Total Sleep Time % REM of Sleep Time:  6 h, 59 min 10.0    Mean: 95 Minimum: 85 Maximum: 98  Mean of Desaturations Nadirs (%):   92  Oxygen Desat. %: 4-9 10-20 >20 Total  Events Number Total  14 100.0  0 0.0  0 0.0  14 100.0  Oxygen Saturation: <90 <=88 <85 <80 <70  Duration (minutes): Sleep % 0.1 0.0 0.0 0.0 0.0 0.0 0.0 0.0 0.0 0.0     Respiratory Indices      Total Events REM NREM All Night  pRDI:   182  pAHI:  47 ODI:  14  pAHIc:  0  % CSR: 0.0 28.4 12.0 3.0 0.0 26.3 6.3 1.9 0.0 26.5 6.9 2.0 0.0       Pulse Rate Statistics during Sleep (BPM)      Mean: 74 Minimum: 45 Maximum: 103    Indices are calculated using technically valid sleep time of 6 h, 51 min. pRDI/pAHI are calculated using 02 desaturations ? 3%  Body Position Statistics  Position Supine Prone Right Left Non-Supine  Sleep (min) 168.5 65.0 93.5 93.0 251.5  Sleep % 40.1 15.5 22.3 22.1 59.9  pRDI 31.3 20.0 21.0 28.0 23.4  pAHI 8.0 3.8 5.3 8.5 6.1  ODI 2.6 1.0 0.7 3.3 1.7     Snoring Statistics Snoring Level (dB) >40 >50 >60 >70 >80 >Threshold (45)  Sleep (min) 248.2 36.2 4.1 0.7 0.0 73.9  Sleep % 59.1 8.6 1.0 0.2 0.0 17.6    Mean: 43 dB

## 2019-12-18 ENCOUNTER — Ambulatory Visit (INDEPENDENT_AMBULATORY_CARE_PROVIDER_SITE_OTHER): Payer: Managed Care, Other (non HMO) | Admitting: Family Medicine

## 2019-12-18 ENCOUNTER — Telehealth: Payer: Self-pay | Admitting: Neurology

## 2019-12-18 ENCOUNTER — Other Ambulatory Visit: Payer: Self-pay

## 2019-12-18 ENCOUNTER — Encounter: Payer: Self-pay | Admitting: Family Medicine

## 2019-12-18 VITALS — BP 138/84 | HR 84 | Ht 68.0 in | Wt 223.0 lb

## 2019-12-18 DIAGNOSIS — I4891 Unspecified atrial fibrillation: Secondary | ICD-10-CM

## 2019-12-18 DIAGNOSIS — E782 Mixed hyperlipidemia: Secondary | ICD-10-CM

## 2019-12-18 DIAGNOSIS — I428 Other cardiomyopathies: Secondary | ICD-10-CM | POA: Diagnosis not present

## 2019-12-18 DIAGNOSIS — I10 Essential (primary) hypertension: Secondary | ICD-10-CM

## 2019-12-18 MED ORDER — LOSARTAN POTASSIUM 25 MG PO TABS: 25.0000 mg | ORAL_TABLET | Freq: Every day | ORAL | 1 refills | Status: DC

## 2019-12-18 NOTE — Telephone Encounter (Signed)
-----   Message from Larey Seat, MD sent at 12/17/2019  4:38 PM EDT ----- There was very mild OSA noted, at AHI of 6.9/h and in REM sleep  AHI was 12/h.  Loud snoring was recorded, primarily in supine position. There  were many intermittent short periods of hypoxemia, a heart rhythm  however cannot be documented on HST.   Recommendations:    This REM dependent mild apnea is associated with upper airway  resistance syndrome (UARS) and intermittent hypoxia. I would  recommend CPAP treatment based on the underlying atrial fib  condition.  Auto CPAP device will be set for a window of 6-15 cm water  pressure, heated humidity, and 2 cm EPR, with a mask of patient's  comfort and choice.   Interpreting Physician: Larey Seat, MD

## 2019-12-18 NOTE — Patient Instructions (Signed)
Your physician recommends that you schedule a follow-up appointment in: [place] physician has recommended you make the following change in your medication:   INCREASE LOSARTAN 25 MG DAILY   Thank you for choosing [facility]!!

## 2019-12-18 NOTE — Telephone Encounter (Signed)
Called patient to discuss sleep study results. No answer at this time. LVM for the patient to call back.   

## 2019-12-23 NOTE — Telephone Encounter (Signed)
Called the patient back and someone else answered. Pt was in shower at the moment. Advised the pt to call our office back.

## 2019-12-23 NOTE — Telephone Encounter (Signed)
Called the patient, someone answered but it was a bunch of noise in the background said hello 4 times and there was no response to me. Hung up and will try back.

## 2019-12-23 NOTE — Telephone Encounter (Signed)
Phone rep checked office voicemail's, @ 12:38 pt left a brief message stating she was calling about her sleep study, please call.

## 2019-12-24 NOTE — Telephone Encounter (Signed)
Pt called again. Please call pt back when available.

## 2019-12-24 NOTE — Telephone Encounter (Signed)
I called pt. I advised pt that Dr. Brett Fairy reviewed their sleep study results and found that pt has mild sleep apnea. Dr. Brett Fairy recommends that pt starts auto CPAP. I reviewed PAP compliance expectations with the pt. Pt is agreeable to starting a CPAP. I advised pt that an order will be sent to a DME, [facility], and [facility] will call the pt within about one week after they file with the pt's insurance. [facility] will show the pt how to use the machine, fit for masks, and troubleshoot the CPAP if needed. A follow up appt was made for insurance purposes with Ward Givens, NP on Aug 5,2021 at 10 am. Pt verbalized understanding to arrive 15 minutes early and bring their CPAP. A letter with all of this information in it will be mailed to the pt as a reminder. I verified with the pt that the address we have on file is correct. Pt verbalized understanding of results. Pt had no questions at this time but was encouraged to call back if questions arise. I have sent the order to [facility] and have received confirmation that they have received the order.

## 2020-01-26 ENCOUNTER — Encounter (HOSPITAL_COMMUNITY): Payer: Self-pay | Admitting: Emergency Medicine

## 2020-01-26 ENCOUNTER — Emergency Department (HOSPITAL_COMMUNITY): Payer: Managed Care, Other (non HMO)

## 2020-01-26 ENCOUNTER — Other Ambulatory Visit: Payer: Self-pay

## 2020-01-26 ENCOUNTER — Emergency Department (HOSPITAL_COMMUNITY)
Admission: EM | Admit: 2020-01-26 | Discharge: 2020-01-26 | Disposition: A | Discharge status: Home / Self Care | Payer: Managed Care, Other (non HMO) | Attending: Emergency Medicine | Admitting: Emergency Medicine

## 2020-01-26 DIAGNOSIS — M549 Dorsalgia, unspecified: Secondary | ICD-10-CM | POA: Diagnosis present

## 2020-01-26 DIAGNOSIS — Z791 Long term (current) use of non-steroidal anti-inflammatories (NSAID): Secondary | ICD-10-CM | POA: Diagnosis not present

## 2020-01-26 DIAGNOSIS — R109 Unspecified abdominal pain: Secondary | ICD-10-CM | POA: Insufficient documentation

## 2020-01-26 DIAGNOSIS — M545 Low back pain, unspecified: Secondary | ICD-10-CM

## 2020-01-26 DIAGNOSIS — F17210 Nicotine dependence, cigarettes, uncomplicated: Secondary | ICD-10-CM | POA: Insufficient documentation

## 2020-01-26 DIAGNOSIS — R110 Nausea: Secondary | ICD-10-CM | POA: Insufficient documentation

## 2020-01-26 LAB — URINALYSIS, ROUTINE W REFLEX MICROSCOPIC
Bacteria, UA: NONE SEEN
Bilirubin Urine: NEGATIVE
Glucose, UA: NEGATIVE mg/dL
Ketones, ur: NEGATIVE mg/dL
Leukocytes,Ua: NEGATIVE
Nitrite: NEGATIVE
Protein, ur: NEGATIVE mg/dL
Specific Gravity, Urine: 1.013 (ref 1.005–1.030)
pH: 7 (ref 5.0–8.0)

## 2020-01-26 LAB — BASIC METABOLIC PANEL
Anion gap: 8 (ref 5–15)
BUN: 19 mg/dL (ref 6–20)
CO2: 24 mmol/L (ref 22–32)
Calcium: 8.7 mg/dL — ABNORMAL LOW (ref 8.9–10.3)
Chloride: 108 mmol/L (ref 98–111)
Creatinine, Ser: 0.73 mg/dL (ref 0.44–1.00)
GFR calc Af Amer: >60 mL/min (ref >60)
GFR calc non Af Amer: >60 mL/min (ref >60)
Glucose, Bld: 141 mg/dL — ABNORMAL HIGH (ref 70–99)
Potassium: 3.7 mmol/L (ref 3.5–5.1)
Sodium: 140 mmol/L (ref 135–145)

## 2020-01-26 LAB — HEPATIC FUNCTION PANEL
ALT: 13 U/L (ref 0–44)
AST: 13 U/L — ABNORMAL LOW (ref 15–41)
Albumin: 3.5 g/dL (ref 3.5–5.0)
Alkaline Phosphatase: 78 U/L (ref 38–126)
Bilirubin, Direct: <0.1 mg/dL (ref 0.0–0.2)
Total Bilirubin: 0.3 mg/dL (ref 0.3–1.2)
Total Protein: 6.6 g/dL (ref 6.5–8.1)

## 2020-01-26 LAB — CBC WITH DIFFERENTIAL/PLATELET
Abs Immature Granulocytes: 0.03 10*3/uL (ref 0.00–0.07)
Basophils Absolute: 0 10*3/uL (ref 0.0–0.1)
Basophils Relative: 0 %
Eosinophils Absolute: 0.1 10*3/uL (ref 0.0–0.5)
Eosinophils Relative: 1 %
HCT: 41.4 % (ref 36.0–46.0)
Hemoglobin: 13.4 g/dL (ref 12.0–15.0)
Immature Granulocytes: 0 %
Lymphocytes Relative: 11 %
Lymphs Abs: 1.3 10*3/uL (ref 0.7–4.0)
MCH: 29.8 pg (ref 26.0–34.0)
MCHC: 32.4 g/dL (ref 30.0–36.0)
MCV: 92 fL (ref 80.0–100.0)
Monocytes Absolute: 0.9 10*3/uL (ref 0.1–1.0)
Monocytes Relative: 8 %
Neutro Abs: 9 10*3/uL — ABNORMAL HIGH (ref 1.7–7.7)
Neutrophils Relative %: 80 %
Platelets: 189 10*3/uL (ref 150–400)
RBC: 4.5 MIL/uL (ref 3.87–5.11)
RDW: 13 % (ref 11.5–15.5)
WBC: 11.4 10*3/uL — ABNORMAL HIGH (ref 4.0–10.5)
nRBC: 0 % (ref 0.0–0.2)

## 2020-01-26 LAB — LIPASE, BLOOD: Lipase: 25 U/L (ref 11–51)

## 2020-01-26 MED ORDER — CYCLOBENZAPRINE HCL 5 MG PO TABS
5.0000 mg | ORAL_TABLET | Freq: Two times a day (BID) | ORAL | 0 refills | Status: DC | PRN
Start: 2020-01-26 — End: 2020-03-12

## 2020-01-26 MED ORDER — CYCLOBENZAPRINE HCL 10 MG PO TABS
5.0000 mg | ORAL_TABLET | Freq: Once | ORAL | Status: AC
  Administered 2020-01-26: 5 mg via ORAL
  Filled 2020-01-26: qty 1

## 2020-01-26 MED ORDER — NAPROXEN 500 MG PO TABS
500.0000 mg | ORAL_TABLET | Freq: Two times a day (BID) | ORAL | 0 refills | Status: DC
Start: 2020-01-26 — End: 2020-02-18

## 2020-01-26 MED ORDER — KETOROLAC TROMETHAMINE 30 MG/ML IJ SOLN
30.0000 mg | Freq: Once | INTRAMUSCULAR | Status: AC
  Administered 2020-01-26: 30 mg via INTRAVENOUS
  Filled 2020-01-26: qty 1

## 2020-01-26 MED ORDER — ONDANSETRON HCL 4 MG/2ML IJ SOLN
4.0000 mg | Freq: Once | INTRAMUSCULAR | Status: AC
  Administered 2020-01-26: 4 mg via INTRAVENOUS
  Filled 2020-01-26: qty 2

## 2020-01-26 MED ORDER — MORPHINE SULFATE (PF) 4 MG/ML IV SOLN
4.0000 mg | Freq: Once | INTRAVENOUS | Status: AC
  Administered 2020-01-26: 4 mg via INTRAVENOUS
  Filled 2020-01-26: qty 1

## 2020-01-26 NOTE — ED Notes (Addendum)
Pt tolerating fluids at this time.

## 2020-01-26 NOTE — ED Provider Notes (Signed)
[facility] Provider Note   CSN: [id number] Arrival date & time: 01/26/20  0019     History Chief Complaint  Patient presents with  . Back Pain    Jade Bradley is a 59 y.o. female.  HPI     This is a 59 year old female with a history of atrial fibrillation who presents with back pain.  Patient reports acute onset of back pain earlier this evening.  She states that she felt fine and then all of a sudden had acute sharp right-sided back pain.  Currently she rates her pain at 9 out of 10.  It was somewhat improved with medications by EMS.  She states that the pain does not radiate but sometimes she feels it in her abdomen.  Has not noted any hematuria or dysuria.  No history of kidney stones.  No fevers.  She reports nausea without vomiting.  She states she previously felt well with no prodromal symptoms.  She denies any injury.  Denies any weakness, numbness, tingling of the lower extremities.  Past Medical History:  Diagnosis Date  . Anxiety   . Atrial fibrillation ([place])   . Hypercholesteremia   . Seasonal allergies     Patient Active Problem List   Diagnosis Date Noted  . Tobacco dependence 12/17/2019  . Non-ischemic cardiomyopathy ([place]) 12/03/2019  . Hyperlipidemia 12/03/2019  . Chest pain 12/02/2019  . Angina pectoris, unspecified ([place]) 12/01/2019  . Paroxysmal atrial fibrillation ([place]) 11/19/2019  . Rapid palpitations 11/19/2019  . Loud snoring 11/19/2019  . Morbid obesity ([place]) 11/19/2019    Past Surgical History:  Procedure Laterality Date  . CHOLECYSTECTOMY    . COLONOSCOPY N/A 04/07/2014   Procedure: COLONOSCOPY;  Surgeon: Danie Binder, MD;  [facility];  Service: Endoscopy;  Laterality: N/A;  8:30 AM  . RIGHT/LEFT HEART CATH AND CORONARY ANGIOGRAPHY N/A 12/03/2019   Procedure: RIGHT/LEFT HEART CATH AND CORONARY ANGIOGRAPHY;  Surgeon: Burnell Blanks, MD;  Location: [facility];  Service: Cardiovascular;   Laterality: N/A;     OB History   No obstetric history on file.     Family History  Problem Relation Age of Onset  . Heart attack Mother   . CAD Mother   . CAD Father   . Heart attack Father   . Hypertension Father   . Diabetes Father   . Colon cancer Neg Hx     Social History   Tobacco Use  . Smoking status: Current Every Day Smoker    Years: 48.00    Types: Cigarettes    Start date: 03/17/1975  . Smokeless tobacco: Never Used  . Tobacco comment: 2 packs per week  Substance Use Topics  . Alcohol use: No  . Drug use: Never    Home Medications Prior to Admission medications   Medication Sig Start Date End Date Taking? Authorizing Provider  cyclobenzaprine (FLEXERIL) 5 MG tablet Take 1 tablet (5 mg total) by mouth 2 (two) times daily as needed for muscle spasms. 01/26/20   Lou Irigoyen, Barbette Hair, MD  losartan (COZAAR) 25 MG tablet Take 1 tablet (25 mg total) by mouth daily. 12/18/19   Verta Ellen., NP  metoprolol succinate (TOPROL-XL) 50 MG 24 hr tablet Take 1 tablet (50 mg total) by mouth daily. 10/31/19   Herminio Commons, MD  naproxen (NAPROSYN) 500 MG tablet Take 1 tablet (500 mg total) by mouth 2 (two) times daily. 01/26/20   Kashtyn Jankowski, Barbette Hair, MD  pravastatin (PRAVACHOL) 20 MG tablet Take 20 mg by mouth daily.    [provider]  rivaroxaban (XARELTO) 20 MG TABS tablet Take 1 tablet (20 mg total) by mouth daily with supper. 11/04/19   Herminio Commons, MD    Allergies    Patient has no known allergies.  Review of Systems   Review of Systems  Constitutional: Negative for fever.  Respiratory: Negative for shortness of breath.   Cardiovascular: Negative for chest pain.  Gastrointestinal: Positive for abdominal pain and nausea. Negative for diarrhea and vomiting.  Genitourinary: Negative for dysuria and hematuria.  Musculoskeletal: Positive for back pain.  Neurological: Negative for headaches.  All other systems reviewed and are  negative.   Physical Exam Updated Vital Signs BP 117/76   Pulse 76   Temp 98.6 F (37 C) (Oral)   Resp 12   Ht 1.727 m (5\' 8" )   Wt 100.2 kg   LMP 08/11/2011   SpO2 95%   BMI 33.60 kg/m   Physical Exam Vitals and nursing note reviewed.  Constitutional:      Appearance: She is well-developed. She is obese. She is not ill-appearing.  HENT:     Head: Normocephalic and atraumatic.     Mouth/Throat:     Mouth: Mucous membranes are moist.  Eyes:     Pupils: Pupils are equal, round, and reactive to light.  Cardiovascular:     Rate and Rhythm: Normal rate and regular rhythm.     Heart sounds: Normal heart sounds.  Pulmonary:     Effort: Pulmonary effort is normal. No respiratory distress.     Breath sounds: No wheezing.  Abdominal:     General: Bowel sounds are normal.     Palpations: Abdomen is soft.     Tenderness: There is no abdominal tenderness. There is no right CVA tenderness, left CVA tenderness, guarding or rebound.  Musculoskeletal:     Cervical back: Neck supple.     Right lower leg: No edema.     Left lower leg: No edema.  Skin:    General: Skin is warm and dry.  Neurological:     Mental Status: She is alert and oriented to person, place, and time.     Comments: 5 out of 5 strength in all 4 extremities  Psychiatric:        Mood and Affect: Mood normal.     ED Results / Procedures / Treatments   Labs (all labs ordered are listed, but only abnormal results are displayed) Labs Reviewed  CBC WITH DIFFERENTIAL/PLATELET - Abnormal; Notable for the following components:      Result Value   WBC 11.4 (*)    Neutro Abs 9.0 (*)    All other components within normal limits  BASIC METABOLIC PANEL - Abnormal; Notable for the following components:   Glucose, Bld 141 (*)    Calcium 8.7 (*)    All other components within normal limits  URINALYSIS, ROUTINE W REFLEX MICROSCOPIC - Abnormal; Notable for the following components:   Hgb urine dipstick MODERATE (*)     All other components within normal limits  HEPATIC FUNCTION PANEL - Abnormal; Notable for the following components:   AST 13 (*)    All other components within normal limits  LIPASE, BLOOD    EKG EKG Interpretation  Date/Time:  Sunday January 26 2020 00:22:17 EDT Ventricular Rate:  73 PR Interval:    QRS Duration: 70 QT Interval:  560 QTC Calculation: 618 R Axis:  78 Text Interpretation: Atrial fibrillation Borderline abnrm T, anterolateral leads Prolonged QT interval Confirmed by Thayer Jew [phone]) on 01/26/2020 1:28:03 AM   Radiology CT Renal Stone Study  Result Date: 01/26/2020 CLINICAL DATA:  Right-sided back pain and flank pain EXAM: CT ABDOMEN AND PELVIS WITHOUT CONTRAST TECHNIQUE: Multidetector CT imaging of the abdomen and pelvis was performed following the standard protocol without IV contrast. COMPARISON:  None. FINDINGS: Lower chest: The visualized heart size within normal limits. No pericardial fluid/thickening. No hiatal hernia. The visualized portions of the lungs are clear. Hepatobiliary: Although limited due to the lack of intravenous contrast, normal in appearance without gross focal abnormality. The gallbladder is not visualized. Pancreas:  Unremarkable.  No surrounding inflammatory changes. Spleen: Normal in size. Although limited due to the lack of intravenous contrast, normal in appearance. Adrenals/Urinary Tract: Within the right adrenal gland there is a low-density lesion measuring 2.2 cm. The left adrenal gland is unremarkable. No renal or collecting system calculi seen. No hydronephrosis. The bladder is unremarkable. Stomach/Bowel: The stomach, small bowel, and colon are normal in appearance. No inflammatory changes or obstructive findings. Scattered colonic diverticula is noted without diverticulitis. Vascular/Lymphatic: There are no enlarged abdominal or pelvic lymph nodes. Scattered aortic atherosclerosis is seen. Reproductive: The uterus and adnexa are  unremarkable. Other: No evidence of abdominal wall mass or hernia. Musculoskeletal: No acute or significant osseous findings. IMPRESSION: 1. No renal or collecting system calculi. 2. No other acute intra-abdominal or pelvic pathology to explain the patient's symptoms. 3. 2.2 cm lesion within the right adrenal gland which could represent adrenal adenoma 4. Diverticulosis without diverticulitis. 5.  Aortic Atherosclerosis (ICD10-I70.0). Electronically Signed   By: Prudencio Pair M.D.   On: 01/26/2020 01:45    Procedures Procedures (including critical care time)  Medications Ordered in ED Medications  morphine 4 MG/ML injection 4 mg (4 mg Intravenous Given 01/26/20 0109)  ondansetron (ZOFRAN) injection 4 mg (4 mg Intravenous Given 01/26/20 0109)  ketorolac (TORADOL) 30 MG/ML injection 30 mg (30 mg Intravenous Given 01/26/20 0429)  cyclobenzaprine (FLEXERIL) tablet 5 mg (5 mg Oral Given 01/26/20 0429)    ED Course  I have reviewed the triage vital signs and the nursing notes.  Pertinent labs & imaging results that were available during my care of the patient were reviewed by me and considered in my medical decision making (see chart for details).  Clinical Course as of Jan 26 519  Sun Jan 26, 2020  0517 Patient improved with flexeril and toradol.   [CH]    Clinical Course User Index [CH] Arnel Wymer, Barbette Hair, MD   MDM Rules/Calculators/A&P                           Patient presents with back pain.  She is overall nontoxic and vital signs are reassuring.  She is in atrial fibrillation.  Back pain was fairly acute in onset.  Considerations include but not limited to musculoskeletal etiology, kidney stone, urinary tract infection, less likely dissection or shingles given absence of rash.  Lab work obtained.  Urinalysis does show moderate hemoglobin.  CT renal stone study obtained.  This does not show any evidence of kidney stone.  She has a normal caliber aorta.  Doubt dissection or AAA.  Patient  has no other red flags or no signs or symptoms of cauda equina.  Patient was initially given morphine and Zofran.  She was subsequently given Toradol Flexeril with significant provement of her  pain.  Consider musculoskeletal etiology.  No signs or symptoms of infection.  Recommend supportive measures at home.  After history, exam, and medical workup I feel the patient has been appropriately medically screened and is safe for discharge home. Pertinent diagnoses were discussed with the patient. Patient was given return precautions.  Final Clinical Impression(s) / ED Diagnoses Final diagnoses:  Acute right-sided low back pain without sciatica    Rx / DC Orders ED Discharge Orders         Ordered    cyclobenzaprine (FLEXERIL) 5 MG tablet  2 times daily PRN     Discontinue  Reprint     01/26/20 0518    naproxen (NAPROSYN) 500 MG tablet  2 times daily     Discontinue  Reprint     01/26/20 0518           Merryl Hacker, MD 01/26/20 [id number]

## 2020-01-26 NOTE — Discharge Instructions (Addendum)
You were seen today for back pain.  Your work-up is largely reassuring.  There is no evidence of kidney stone.  This could be related to musculoskeletal pain.  Take medications as prescribed.  Pain acutely worsens or does not resolve, you should be reevaluated by her primary doctor later this week.

## 2020-01-26 NOTE — ED Triage Notes (Signed)
Pt C/O right sided back pain that started at 2030 last night. Denies urinary symptoms. Pain is reproducible upon palpation. Reports N/V. Received 154mcg fentanyl and 4mg  zofran en route per [facility].

## 2020-02-03 ENCOUNTER — Ambulatory Visit: Payer: Managed Care, Other (non HMO) | Admitting: Cardiovascular Disease

## 2020-02-04 ENCOUNTER — Ambulatory Visit: Payer: Managed Care, Other (non HMO) | Admitting: Family Medicine

## 2020-02-04 NOTE — Progress Notes (Deleted)
Cardiology Office Note  Date: 02/04/2020   ID: Jade Bradley, DOB [birth year], MRN [id number]  PCP:  Sharilyn Sites, MD  Cardiologist:  Kate Sable, MD Electrophysiologist:  None   Chief Complaint: Follow-up atrial fibrillation with RVR  History of Present Illness: Jade Bradley is a 58 y.o. female with a history of A. fib with RVR.  Last seen by Dr. Bronson Ing on 10/31/2019: She was found to be tachycardic on physical exam with a regular rhythm on 10/10/2019 EKG was reportedly obtained at PCP office stating she appeared to be in rapid atrial fibrillation.  She was a started on Toprol-XL and Xarelto.  She had been experiencing palpitations for least a month prior to presenting to her PCP.  She had episodic chest pressure both at rest and with exertion.  Had occasional dizziness but no syncope.  She denied bleeding issues since being on Xarelto.  Has a history of snoring.  Has a stressful job.  Toprol was increased to 50 mg daily.  Echocardiogram was ordered.  Discussion of possible DC cardioversion if she remains symptomatic in spite of adequate heart rate control.  BP was well controlled on current therapy.  Sleep disordered breathing.  She is obese and has hypertension and new onset atrial fibrillation.  Long history of snoring high risk for obstructive sleep apnea.  Referral for sleep study was made.  Past Medical History:  Diagnosis Date   Anxiety    Atrial fibrillation ([place])    Hypercholesteremia    Seasonal allergies     Past Surgical History:  Procedure Laterality Date   CHOLECYSTECTOMY     COLONOSCOPY N/A 04/07/2014   Procedure: COLONOSCOPY;  Surgeon: Danie Binder, MD;  [facility];  Service: Endoscopy;  Laterality: N/A;  8:30 AM   RIGHT/LEFT HEART CATH AND CORONARY ANGIOGRAPHY N/A 12/03/2019   Procedure: RIGHT/LEFT HEART CATH AND CORONARY ANGIOGRAPHY;  Surgeon: Burnell Blanks, MD;  Location: [facility];  Service: Cardiovascular;   Laterality: N/A;    Current Outpatient Medications  Medication Sig Dispense Refill   cyclobenzaprine (FLEXERIL) 5 MG tablet Take 1 tablet (5 mg total) by mouth 2 (two) times daily as needed for muscle spasms. 10 tablet 0   losartan (COZAAR) 25 MG tablet Take 1 tablet (25 mg total) by mouth daily. 90 tablet 1   metoprolol succinate (TOPROL-XL) 50 MG 24 hr tablet Take 1 tablet (50 mg total) by mouth daily. 30 tablet 6   naproxen (NAPROSYN) 500 MG tablet Take 1 tablet (500 mg total) by mouth 2 (two) times daily. 30 tablet 0   pravastatin (PRAVACHOL) 20 MG tablet Take 20 mg by mouth daily.     rivaroxaban (XARELTO) 20 MG TABS tablet Take 1 tablet (20 mg total) by mouth daily with supper. 28 tablet 0   No current facility-administered medications for this visit.   Allergies:  Patient has no known allergies.   Social History: The patient  reports that she has been smoking cigarettes. She started smoking about 44 years ago. She has smoked for the past 48.00 years. She has never used smokeless tobacco. She reports that she does not drink alcohol and does not use drugs.   Family History: The patient's family history includes CAD in her father and mother; Diabetes in her father; Heart attack in her father and mother; Hypertension in her father.   ROS:  Please see the history of present illness. Otherwise, complete review of systems is positive for {NONE DEFAULTED:18576::"none"}.  All other systems are reviewed and negative.   Physical Exam: VS:  LMP 08/11/2011 , BMI There is no height or weight on file to calculate BMI.  Wt Readings from Last 3 Encounters:  01/26/20 221 lb (100.2 kg)  12/18/19 223 lb (101.2 kg)  12/01/19 221 lb (100.2 kg)    General: Patient appears comfortable at rest. HEENT: Conjunctiva and lids normal, oropharynx clear with moist mucosa. Neck: Supple, no elevated JVP or carotid bruits, no thyromegaly. Lungs: Clear to auscultation, nonlabored breathing at  rest. Cardiac: Regular rate and rhythm, no S3 or significant systolic murmur, no pericardial rub. Abdomen: Soft, nontender, no hepatomegaly, bowel sounds present, no guarding or rebound. Extremities: No pitting edema, distal pulses 2+. Skin: Warm and dry. Musculoskeletal: No kyphosis. Neuropsychiatric: Alert and oriented x3, affect grossly appropriate.  ECG:  {EKG/Telemetry Strips Reviewed:[phone]}  Recent Labwork: 12/02/2019: Magnesium 2.2 01/26/2020: ALT 13; AST 13; BUN 19; Creatinine, Ser 0.73; Hemoglobin 13.4; Platelets 189; Potassium 3.7; Sodium 140     Component Value Date/Time   CHOL 137 12/01/2019 1459   TRIG 115 12/01/2019 1459   HDL 35 (L) 12/01/2019 1459   CHOLHDL 3.9 12/01/2019 1459   VLDL 23 12/01/2019 1459   [place] 79 12/01/2019 1459    Other Studies Reviewed Today:  Home sleep study 12/11/2018  Summary & Diagnosis:   There was very mild OSA noted, at AHI of 6.9/h and in REM sleep  AHI was 12/h.  Loud snoring was recorded, primarily in supine position. There  were many intermittent short periods of hypoxemia, a heart rhythm  however cannot be documented on HST.   Recommendations:    This REM dependent mild apnea is associated with upper airway  resistance syndrome (UARS) and intermittent hypoxia. I would  recommend CPAP treatment based on the underlying atrial fib  condition.  Auto CPAP device will be set for a window of 6-15 cm water  pressure, heated humidity, and 2 cm EPR, with a mask of patients    Cardiac catheterization 12/03/2019 1. No angiographic evidence of CAD 2. Normal right and left heart pressures.   Recommendations: No further ischemic workup. Resume Xarelto tomorrow. OK to discharge home today.    Echocardiogram 11/21/2019 IMPRESSIONS  1. Global hypokinesis. More severe hypokinesis of the anteroseptal wall.  . Left ventricular ejection fraction, by estimation, is 35 to 40%. The  left ventricle has moderately decreased function.  The left ventricle  demonstrates global hypokinesis. The  left ventricular internal cavity size was mildly dilated. Left ventricular  diastolic parameters are indeterminate.  2. Right ventricular systolic function is normal. The right ventricular  size is normal. There is normal pulmonary artery systolic pressure.  3. Left atrial size was severely dilated.  4. Right atrial size was mildly dilated.  5. The mitral valve is normal in structure. Trivial mitral valve  regurgitation. No evidence of mitral stenosis.  6. The aortic valve is tricuspid. Aortic valve regurgitation is not  visualized. No aortic stenosis is present.  7. The inferior vena cava is normal in size with greater than 50%  respiratory variability, suggesting right atrial pressure of 3 mmHg.  8. Probably small PFO with mild left to right shunt. Assessment and Plan:  1. New onset atrial fibrillation ([place])   2. Essential hypertension   3. Sleep disorder breathing      Medication Adjustments/Labs and Tests Ordered: Current medicines are reviewed at length with the patient today.  Concerns regarding medicines are outlined above.   Disposition: Follow-up  with ***  Signed, Levell July, NP 02/04/2020 7:47 AM    [facility] [place], [place], [place] [address] Phone: [phone]; Fax: [phone]

## 2020-02-12 ENCOUNTER — Ambulatory Visit: Payer: Managed Care, Other (non HMO) | Admitting: Family Medicine

## 2020-02-18 ENCOUNTER — Encounter: Payer: Self-pay | Admitting: Family Medicine

## 2020-02-18 ENCOUNTER — Ambulatory Visit (INDEPENDENT_AMBULATORY_CARE_PROVIDER_SITE_OTHER): Payer: Managed Care, Other (non HMO) | Admitting: Family Medicine

## 2020-02-18 VITALS — BP 140/80 | HR 102 | Ht 68.0 in | Wt 215.8 lb

## 2020-02-18 DIAGNOSIS — E782 Mixed hyperlipidemia: Secondary | ICD-10-CM

## 2020-02-18 DIAGNOSIS — G4733 Obstructive sleep apnea (adult) (pediatric): Secondary | ICD-10-CM

## 2020-02-18 DIAGNOSIS — I428 Other cardiomyopathies: Secondary | ICD-10-CM | POA: Diagnosis not present

## 2020-02-18 DIAGNOSIS — I480 Paroxysmal atrial fibrillation: Secondary | ICD-10-CM

## 2020-02-18 DIAGNOSIS — F17200 Nicotine dependence, unspecified, uncomplicated: Secondary | ICD-10-CM

## 2020-02-18 DIAGNOSIS — I10 Essential (primary) hypertension: Secondary | ICD-10-CM

## 2020-02-18 NOTE — Patient Instructions (Addendum)
Your physician wants you to follow-up in: 6 MONTHS You will receive a reminder letter in the mail two months in advance. If you don't receive a letter, please call our office to schedule the follow-up appointment.  Your physician recommends that you continue on your current medications as directed. Please refer to the Current Medication list given to you today.  Thank you for choosing [facility]!!    

## 2020-02-18 NOTE — Progress Notes (Signed)
Cardiology Office Note  Date: 02/18/2020   ID: MIIA BLANKS, DOB 09-07-1960, MRN [id number]  PCP:  Sharilyn Sites, MD  Cardiologist:  Kate Sable, MD Electrophysiologist:  None   Chief Complaint: Follow-up atrial fibrillation with RVR  History of Present Illness: Jade Bradley is a 59 y.o. female with a history of A. fib with RVR.  Last seen by Dr. Bronson Ing on 10/31/2019: She was found to be tachycardic on physical exam with a regular rhythm on 10/10/2019.  EKG was reportedly obtained at PCP office stating she appeared to be in rapid atrial fibrillation.  She was a started on Toprol-XL and Xarelto.  She had been experiencing palpitations for least a month prior to presenting to her PCP.  She had episodic chest pressure both at rest and with exertion.  Had occasional dizziness but no syncope.  She denied bleeding issues since being on Xarelto.  Has a history of snoring.  Has a stressful job.  Toprol was increased to 50 mg daily.  Echocardiogram was ordered.  Discussion of possible DC cardioversion if she remained symptomatic in spite of adequate heart rate control.  BP was well controlled on current therapy.  Sleep disordered breathing.  She is obese and has hypertension and new onset atrial fibrillation.  Long history of snoring, high risk for obstructive sleep apnea.   Patient states she has been doing okay since Toprol dosage was increased.  Noticing less palpitations and increased heart rates.  States blood pressure has improved since increasing dose of losartan.  Heart rate on arrival today initially was 101.  EKG shows heart rate of 80 with atrial fibrillation.  Patient states she has been stressed out recently.  Her husband was recently diagnosed with cancer.  States she has been running around a lot with her husband today.  States her heart rate has been up and down.  She brings with her her a log of blood pressures which appear to be well controlled in the 937J to 696V  systolic.  Patient states she is using her CPAP and seems to be tolerating it okay but may need some adjustment.   Past Medical History:  Diagnosis Date  . Anxiety   . Atrial fibrillation ([place])   . Hypercholesteremia   . Seasonal allergies     Past Surgical History:  Procedure Laterality Date  . CHOLECYSTECTOMY    . COLONOSCOPY N/A 04/07/2014   Procedure: COLONOSCOPY;  Surgeon: Danie Binder, MD;  [facility];  Service: Endoscopy;  Laterality: N/A;  8:30 AM  . RIGHT/LEFT HEART CATH AND CORONARY ANGIOGRAPHY N/A 12/03/2019   Procedure: RIGHT/LEFT HEART CATH AND CORONARY ANGIOGRAPHY;  Surgeon: Burnell Blanks, MD;  Location: [facility];  Service: Cardiovascular;  Laterality: N/A;    Current Outpatient Medications  Medication Sig Dispense Refill  . cyclobenzaprine (FLEXERIL) 5 MG tablet Take 1 tablet (5 mg total) by mouth 2 (two) times daily as needed for muscle spasms. 10 tablet 0  . losartan (COZAAR) 25 MG tablet Take 1 tablet (25 mg total) by mouth daily. 90 tablet 1  . metoprolol succinate (TOPROL-XL) 50 MG 24 hr tablet Take 1 tablet (50 mg total) by mouth daily. 30 tablet 6  . pravastatin (PRAVACHOL) 20 MG tablet Take 20 mg by mouth daily.    . rivaroxaban (XARELTO) 20 MG TABS tablet Take 1 tablet (20 mg total) by mouth daily with supper. 28 tablet 0   No current facility-administered medications for this visit.  Allergies:  Patient has no known allergies.   Social History: The patient  reports that she has been smoking cigarettes. She started smoking about 44 years ago. She has smoked for the past 48.00 years. She has never used smokeless tobacco. She reports that she does not drink alcohol and does not use drugs.   Family History: The patient's family history includes CAD in her father and mother; Diabetes in her father; Heart attack in her father and mother; Hypertension in her father.   ROS:  Please see the history of present illness. Otherwise,  complete review of systems is positive for none.  All other systems are reviewed and negative.   Physical Exam: VS:  BP 140/80   Pulse (!) 102   Ht 5\' 8"  (1.727 m)   Wt 215 lb 12.8 oz (97.9 kg)   LMP 08/11/2011   SpO2 96%   BMI 32.81 kg/m , BMI Body mass index is 32.81 kg/m.  Wt Readings from Last 3 Encounters:  02/18/20 215 lb 12.8 oz (97.9 kg)  01/26/20 221 lb (100.2 kg)  12/18/19 223 lb (101.2 kg)    General: Patient appears comfortable at rest. Neck: Supple, no elevated JVP or carotid bruits, no thyromegaly. Lungs: Clear to auscultation, nonlabored breathing at rest. Cardiac: Irregularly irregular rate and rhythm, no S3 or significant systolic murmur, no pericardial rub. Abdomen: Soft, nontender, no hepatomegaly, bowel sounds present, no guarding or rebound. Extremities: No pitting edema, distal pulses 2+. Skin: Warm and dry. Musculoskeletal: No kyphosis. Neuropsychiatric: Alert and oriented x3, affect grossly appropriate.  ECG:  An ECG dated 02/18/2020 was personally reviewed today and demonstrated:  Atrial fibrillation rate of 80  Recent Labwork: 12/02/2019: Magnesium 2.2 01/26/2020: ALT 13; AST 13; BUN 19; Creatinine, Ser 0.73; Hemoglobin 13.4; Platelets 189; Potassium 3.7; Sodium 140     Component Value Date/Time   CHOL 137 12/01/2019 1459   TRIG 115 12/01/2019 1459   HDL 35 (L) 12/01/2019 1459   CHOLHDL 3.9 12/01/2019 1459   VLDL 23 12/01/2019 1459   [place]ester 79 12/01/2019 1459    Other Studies Reviewed Today:  Home sleep study 12/11/2018  Summary & Diagnosis:   There was very mild OSA noted, at AHI of 6.9/h and in REM sleep  AHI was 12/h.  Loud snoring was recorded, primarily in supine position. There  were many intermittent short periods of hypoxemia, a heart rhythm  however cannot be documented on HST.   Recommendations:    This REM dependent mild apnea is associated with upper airway  resistance syndrome (UARS) and intermittent hypoxia. I would   recommend CPAP treatment based on the underlying atrial fib  condition.  Auto CPAP device will be set for a window of 6-15 cm water  pressure, heated humidity, and 2 cm EPR, with a mask of patient's    Cardiac catheterization 12/03/2019 1. No angiographic evidence of CAD 2. Normal right and left heart pressures.   Recommendations: No further ischemic workup. Resume Xarelto tomorrow. OK to discharge home today.    Echocardiogram 11/21/2019 IMPRESSIONS  1. Global hypokinesis. More severe hypokinesis of the anteroseptal wall.  . Left ventricular ejection fraction, by estimation, is 35 to 40%. The  left ventricle has moderately decreased function. The left ventricle  demonstrates global hypokinesis. The  left ventricular internal cavity size was mildly dilated. Left ventricular  diastolic parameters are indeterminate.  2. Right ventricular systolic function is normal. The right ventricular  size is normal. There is normal pulmonary artery systolic pressure.  3. Left atrial size was severely dilated.  4. Right atrial size was mildly dilated.  5. The mitral valve is normal in structure. Trivial mitral valve  regurgitation. No evidence of mitral stenosis.  6. The aortic valve is tricuspid. Aortic valve regurgitation is not  visualized. No aortic stenosis is present.  7. The inferior vena cava is normal in size with greater than 50%  respiratory variability, suggesting right atrial pressure of 3 mmHg.  8. Probably small PFO with mild left to right shunt.  Assessment and Plan:  1. Paroxysmal atrial fibrillation (HCC)   2. Non-ischemic cardiomyopathy ([place])   3. OSA (obstructive sleep apnea)   4. Mixed hyperlipidemia   5. Essential hypertension   6. Smoking     1. Paroxysmal atrial fibrillation (HCC) Heart rate is controlled today with a rate of 80 on EKG.  Atrial fibrillation remains.  Continue Toprol-XL 50 mg p.o. daily.  Continue Xarelto 20 mg daily.  2. Non-ischemic  cardiomyopathy ([place]) Recent echocardiogram 11/21/2019 EF 35 to 40%.  Global hypokinesis.  More severe hypokinesis of anteroseptal wall.  Severe LA dilation, mild RA dilation, trivial MR, probable small PFO with mild left-to-right shunt.  Denies any clinical heart failure-like symptoms   3. OSA (obstructive sleep apnea) Patient states she is compliant with CPAP but the mask makes her feel claustrophobic at times as if she cannot breathe.  States she is going to follow-up with pulmonary in the near future for possible adjustment.  4.  Hyperlipidemia Recent lipid profile showed a total cholesterol 137, triglycerides 117, HDL 35, LDL 79.  Continue pravastatin 20 mg daily  5.  Hypertension Blood pressure much better since increasing losartan to 25 mg daily at last visit.  She brings with her a log of blood pressures with systolic pressures running in the low 120s to low 130s.  6.  Smoking Patient states she has been cutting down on her smoking.  However she states she is replacing smoking with eating and has gained some weight.  Advised to continue to attempt to stop smoking.  Patient states she is trying.  Medication Adjustments/Labs and Tests Ordered: Current medicines are reviewed at length with the patient today.  Concerns regarding medicines are outlined above.   Disposition: Follow-up with Dr. Domenic Polite or APP 6 months  Signed, Levell July, NP 02/18/2020 4:09 PM    [facility] at [place], [place], [place] [address] Phone: [phone]; Fax: [phone]

## 2020-03-10 ENCOUNTER — Encounter: Payer: Self-pay | Admitting: Adult Health

## 2020-03-12 ENCOUNTER — Ambulatory Visit (INDEPENDENT_AMBULATORY_CARE_PROVIDER_SITE_OTHER): Payer: Managed Care, Other (non HMO) | Admitting: Adult Health

## 2020-03-12 ENCOUNTER — Encounter: Payer: Self-pay | Admitting: Adult Health

## 2020-03-12 VITALS — BP 152/90 | HR 102 | Ht 68.0 in | Wt 228.0 lb

## 2020-03-12 DIAGNOSIS — G4733 Obstructive sleep apnea (adult) (pediatric): Secondary | ICD-10-CM | POA: Diagnosis not present

## 2020-03-12 DIAGNOSIS — Z9989 Dependence on other enabling machines and devices: Secondary | ICD-10-CM | POA: Diagnosis not present

## 2020-03-12 NOTE — Progress Notes (Signed)
PATIENT: Jade Bradley DOB: Feb 10, 1961  REASON FOR VISIT: follow up HISTORY FROM: patient  HISTORY OF PRESENT ILLNESS: Today 03/12/20:  Ms. Jade Bradley is a 59 year old female with a history of obstructive sleep apnea on CPAP.  Her download indicates that she use her machine 22 out of 30 days for compliance of 73%.  She use her machine greater than 4 hours 16 days for compliance of 53%.  On average she uses her machine 4 hours and 31 minutes.  Her residual AHI is 1 on 6 to 15 cm of water with EPR of 2.  Leak in the 95th percentile is 0.9 L/min.  She reports that the CPAP is working well for her.  She states that there are some nights that she did not use it very often because she is caring for her husband.  HISTORY (Copied from Dr.Dohmeier's note)  Raeven Pint Richardsonis a 59  year old Caucasian female patientseen on 11/19/2019. Chiefconcernaccording to patient : I have atrial fibrillation and Dr Bronson Ing wants me to have a sleep study, I smoke since age 70.   I have the pleasure of seeing Jade Bradley right -handed  Caucasian female with a past medical history of Anxiety, Atrial fibrillation ([place]), Hypercholesteremia, and Seasonal allergies. Obesity, tobacco use,   Sleeprelevant medical history:  None.   Familymedical /sleep history:No other family member on CPAP with OSA, insomnia, no sleep walkers.  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  The patient currently works early shifts- since 2004.  Pets are not present. Tobacco use: since age 71. Now at 2 packs a week. ETOH use none , Caffeine intake in form of Coffee( 3-4 day) Soda( 1/ week) Tea ( seldomly ) no energy drinks. Regular exercise in form of:" on my feet all day"      Sleep habits are as follows:The patient's dinner time is between 3.30 PM. The  patient goes to bed at 8.30 PM and continues to sleep for 6 hours, wakes for 1 bathroom break. Feet hurt at night- burning.  The preferred sleep position is prone , with the support of 2 pillows, flat bed.  Dreams are reportedly infrequently.  2.45 AM is the usual rise time. The patient wakes up with 2 alarms.  She reports not feeling refreshed or restored in AM, with residual fatigue. Naps are taken rarely.   REVIEW OF SYSTEMS: Out of a complete 14 system review of symptoms, the patient complains only of the following symptoms, and all other reviewed systems are negative.  FSS 12 ESS2  ALLERGIES: No Known Allergies  HOME MEDICATIONS: Outpatient Medications Prior to Visit  Medication Sig Dispense Refill  . losartan (COZAAR) 25 MG tablet Take 1 tablet (25 mg total) by mouth daily. 90 tablet 1  . metoprolol succinate (TOPROL-XL) 50 MG 24 hr tablet Take 1 tablet (50 mg total) by mouth daily. 30 tablet 6  . pravastatin (PRAVACHOL) 20 MG tablet Take 20 mg by mouth daily.    . rivaroxaban (XARELTO) 20 MG TABS tablet Take 1 tablet (20 mg total) by mouth daily with supper. 28 tablet 0  . cyclobenzaprine (FLEXERIL) 5 MG tablet Take 1 tablet (5 mg total) by mouth 2 (two) times daily as needed for muscle spasms. 10 tablet 0   No facility-administered medications prior to visit.    PAST MEDICAL HISTORY:  Past Medical History:  Diagnosis Date  . Anxiety   . Atrial fibrillation ([place])   . Hypercholesteremia   . Seasonal allergies     PAST SURGICAL HISTORY: Past Surgical History:  Procedure Laterality Date  . CHOLECYSTECTOMY    . COLONOSCOPY N/A 04/07/2014   Procedure: COLONOSCOPY;  Surgeon: Danie Binder, MD;  [facility];  Service: Endoscopy;  Laterality: N/A;  8:30 AM  . RIGHT/LEFT HEART CATH AND CORONARY ANGIOGRAPHY N/A 12/03/2019   Procedure: RIGHT/LEFT HEART CATH AND CORONARY ANGIOGRAPHY;  Surgeon: Burnell Blanks, MD;  Location: [facility] [facility];  Service:  Cardiovascular;  Laterality: N/A;    FAMILY HISTORY: Family History  Problem Relation Age of Onset  . Heart attack Mother   . CAD Mother   . CAD Father   . Heart attack Father   . Hypertension Father   . Diabetes Father   . Colon cancer Neg Hx     SOCIAL HISTORY: Social History   Socioeconomic History  . Marital status: Divorced    Spouse name: Not on file  . Number of children: 3  . Years of education: 10th grade  . Highest education level: Not on file  Occupational History  . Occupation: Animal nutritionist  Tobacco Use  . Smoking status: Current Some Day Smoker    Years: 48.00    Types: Cigarettes    Start date: 03/17/1975  . Smokeless tobacco: Never Used  Substance and Sexual Activity  . Alcohol use: No  . Drug use: Never  . Sexual activity: Not on file  Other Topics Concern  . Not on file  Social History Narrative   Lives at home with husband.   Right-handed.   Three cups caffeine per day.   Social Determinants of Health   Financial Resource Strain:   . Difficulty of Paying Living Expenses:   Food Insecurity:   . Worried About Charity fundraiser in the Last Year:   . Arboriculturist in the Last Year:   Transportation Needs:   . Film/video editor (Medical):   Marland Kitchen Lack of Transportation (Non-Medical):   Physical Activity:   . Days of Exercise per Week:   . Minutes of Exercise per Session:   Stress:   . Feeling of Stress :   Social Connections:   . Frequency of Communication with Friends and Family:   . Frequency of Social Gatherings with Friends and Family:   . Attends Religious Services:   . Active Member of Clubs or Organizations:   . Attends Archivist Meetings:   Marland Kitchen Marital Status:   Intimate Partner Violence:   . Fear of Current or Ex-Partner:   . Emotionally Abused:   Marland Kitchen Physically Abused:   . Sexually Abused:       PHYSICAL EXAM  Vitals:   03/12/20 0950  BP: (!) 152/90  Pulse: (!) 102  Weight: 228 lb (103.4 kg)    Height: 5\' 8"  (1.727 m)   Body mass index is 34.67 kg/m.  Generalized: Well developed, in no acute distress  Chest: Lungs clear to auscultation bilaterally  Neurological examination  Mentation: Alert oriented to time, place, history taking. Follows all commands speech and language fluent Cranial nerve II-XII: Extraocular movements were full, visual field were full on confrontational test Head turning and shoulder shrug  were normal and symmetric. Motor: The motor testing reveals 5 over 5 strength of all 4 extremities. Good symmetric motor tone is noted throughout.  Sensory:  Sensory testing is intact to soft touch on all 4 extremities. No evidence of extinction is noted.  Gait and station: Gait is normal.    DIAGNOSTIC DATA (LABS, IMAGING, TESTING) - I reviewed patient records, labs, notes, testing and imaging myself where available.  Lab Results  Component Value Date   WBC 11.4 (H) 01/26/2020   HGB 13.4 01/26/2020   HCT 41.4 01/26/2020   MCV 92.0 01/26/2020   PLT 189 01/26/2020      Component Value Date/Time   NA 140 01/26/2020 0125   K 3.7 01/26/2020 0125   CL 108 01/26/2020 0125   CO2 24 01/26/2020 0125   GLUCOSE 141 (H) 01/26/2020 0125   BUN 19 01/26/2020 0125   CREATININE 0.73 01/26/2020 0125   CALCIUM 8.7 (L) 01/26/2020 0125   PROT 6.6 01/26/2020 0125   ALBUMIN 3.5 01/26/2020 0125   AST 13 (L) 01/26/2020 0125   ALT 13 01/26/2020 0125   ALKPHOS 78 01/26/2020 0125   BILITOT 0.3 01/26/2020 0125   GFRNONAA >60 01/26/2020 0125   GFRAA >60 01/26/2020 0125   Lab Results  Component Value Date   CHOL 137 12/01/2019   HDL 35 (L) 12/01/2019   LDLCALC 79 12/01/2019   TRIG 115 12/01/2019   CHOLHDL 3.9 12/01/2019   Lab Results  Component Value Date   HGBA1C 5.7 (H) 12/01/2019   No results found for: VITAMINB12 No results found for: TSH    ASSESSMENT AND PLAN 59 y.o. year old female  has a past medical history of Anxiety, Atrial fibrillation ([place]),  Hypercholesteremia, and Seasonal allergies. here with:  1. OSA on CPAP  - CPAP compliance suboptimal - Good treatment of AHI  - Encourage patient to use CPAP nightly and > 4 hours each night - F/U in 6 months or sooner if needed   I spent 20 minutes of face-to-face and non-face-to-face time with patient.  This included previsit chart review, lab review, study review, order entry, electronic health record documentation, patient education.  Ward Givens, MSN, NP-C 03/12/2020, 10:38 AM [facility] [address] [place], [place] [address] [phone]

## 2020-04-21 ENCOUNTER — Other Ambulatory Visit: Payer: Self-pay | Admitting: Family Medicine

## 2020-04-21 ENCOUNTER — Other Ambulatory Visit: Payer: Self-pay | Admitting: *Deleted

## 2020-04-21 MED ORDER — METOPROLOL SUCCINATE ER 50 MG PO TB24: 50.0000 mg | ORAL_TABLET | Freq: Every day | ORAL | 6 refills | Status: DC

## 2020-04-21 MED ORDER — RIVAROXABAN 20 MG PO TABS: 20.0000 mg | ORAL_TABLET | Freq: Every day | ORAL | 6 refills | Status: DC

## 2020-08-31 ENCOUNTER — Other Ambulatory Visit: Payer: Self-pay | Admitting: Family Medicine

## 2020-09-04 IMAGING — MG DIGITAL SCREENING BILAT W/ TOMO W/ CAD
6 of 12 series · 6 of 36 positions shown · non-contrast
Comparison: Previous exam(s).

CLINICAL DATA: Screening.

EXAM:
DIGITAL SCREENING BILATERAL MAMMOGRAM WITH TOMO AND CAD

[L CC synth-2D (1 of 2)]
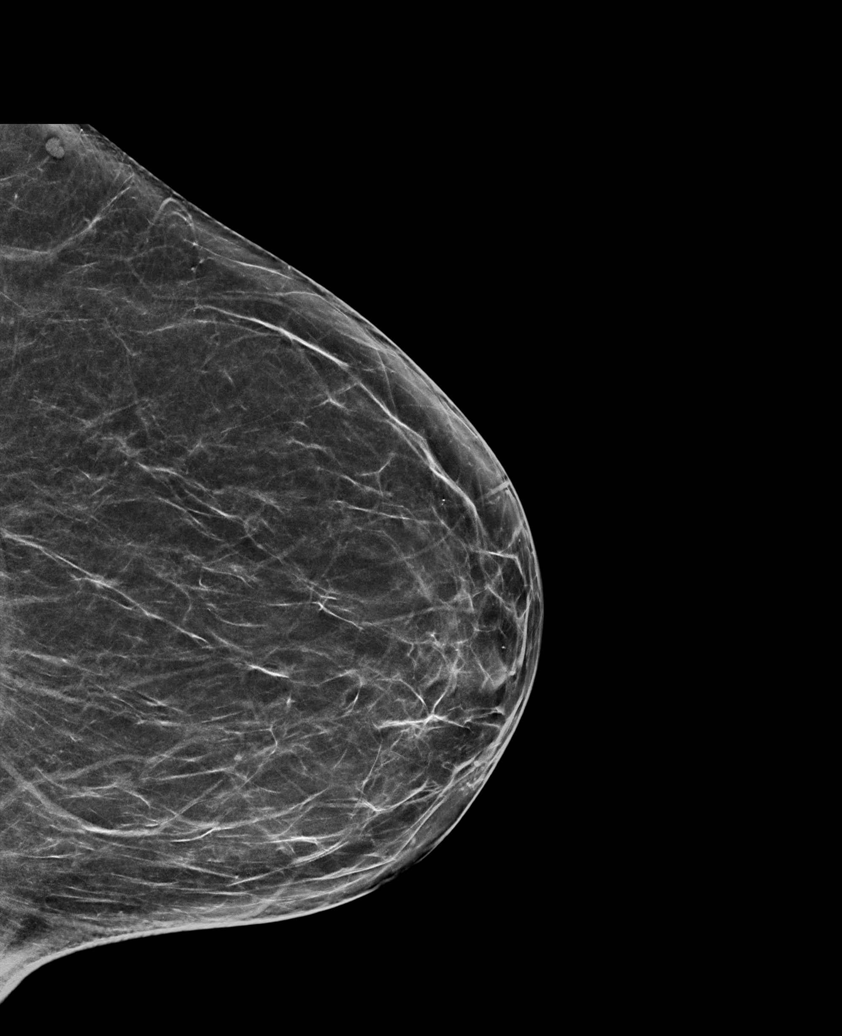

[L MLO synth-2D]
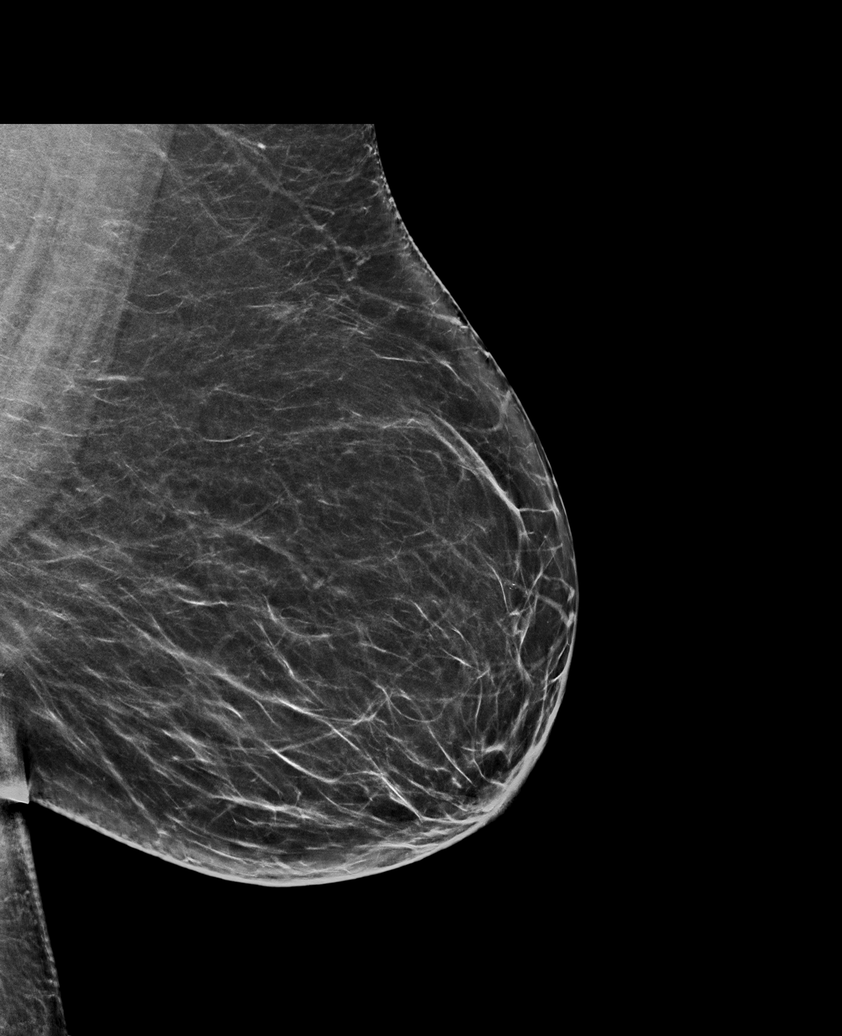

[L CC synth-2D (2 of 2)]
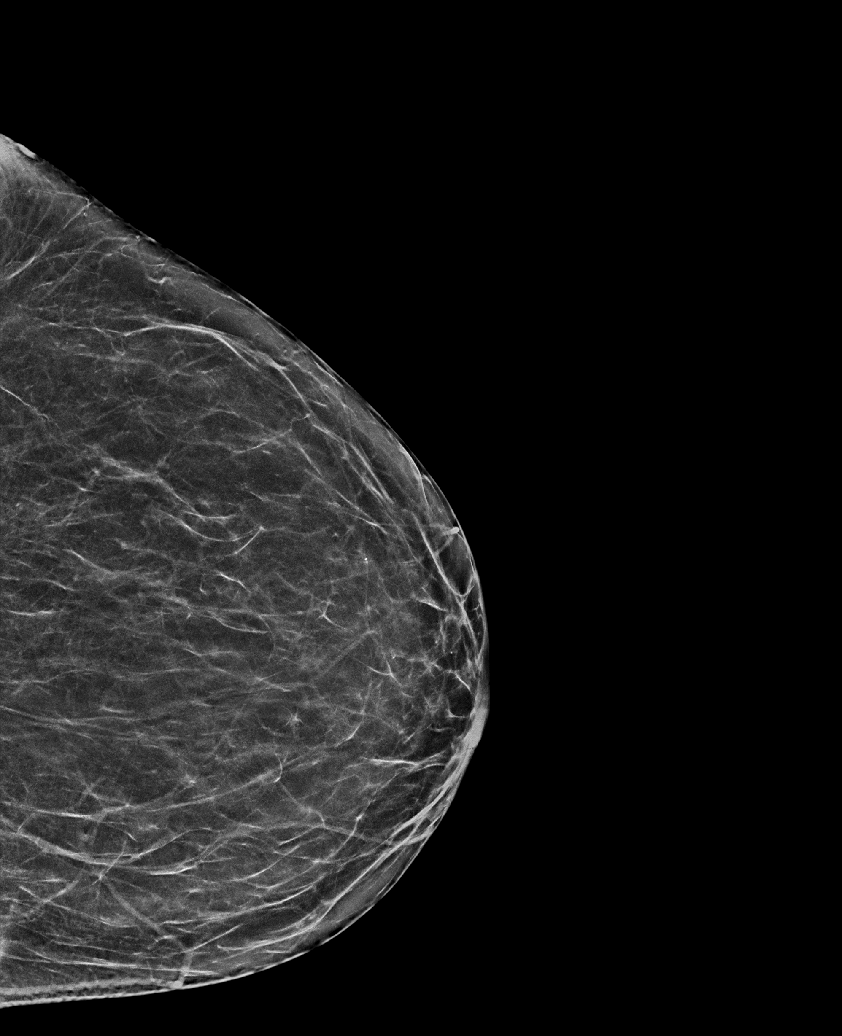

[R CC synth-2D (1 of 2)]
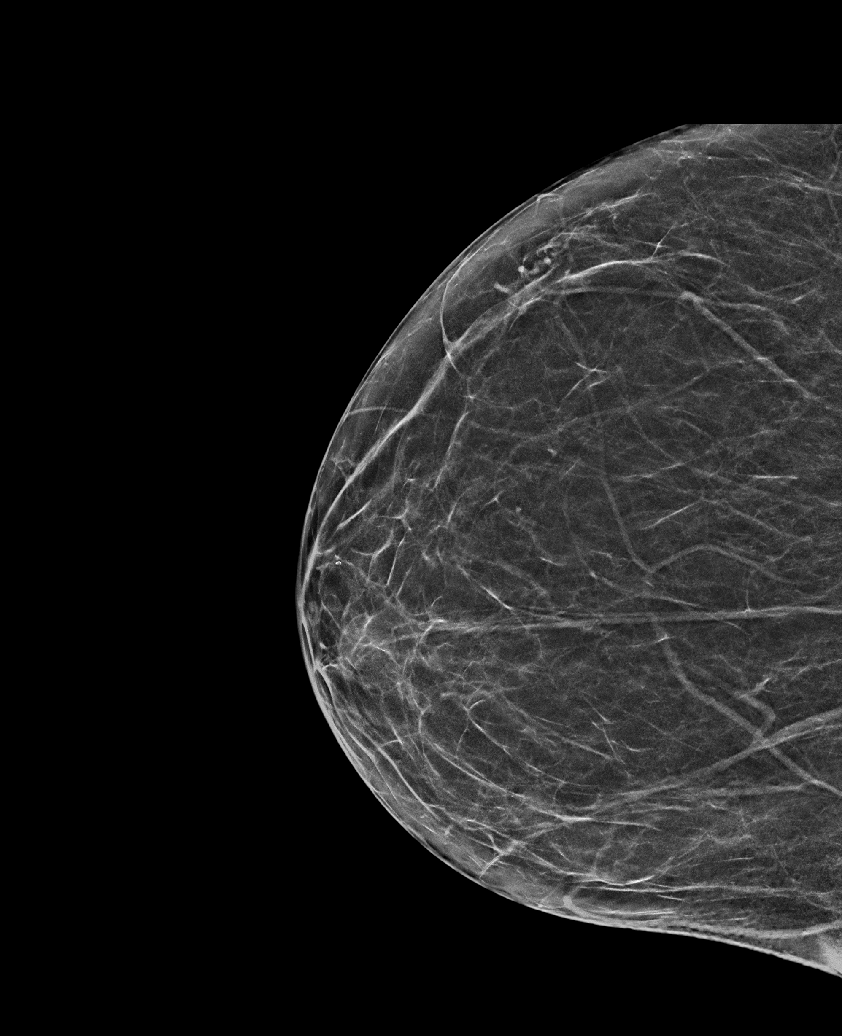

[R CC synth-2D (2 of 2)]
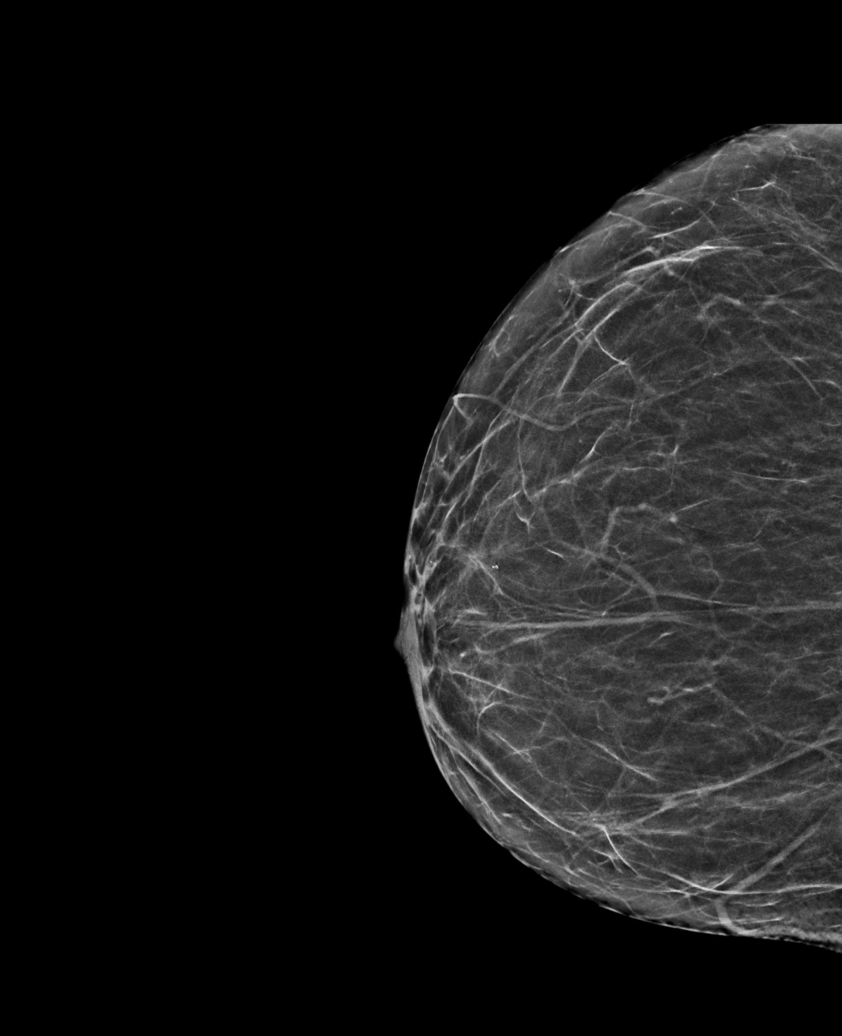

[R MLO synth-2D]
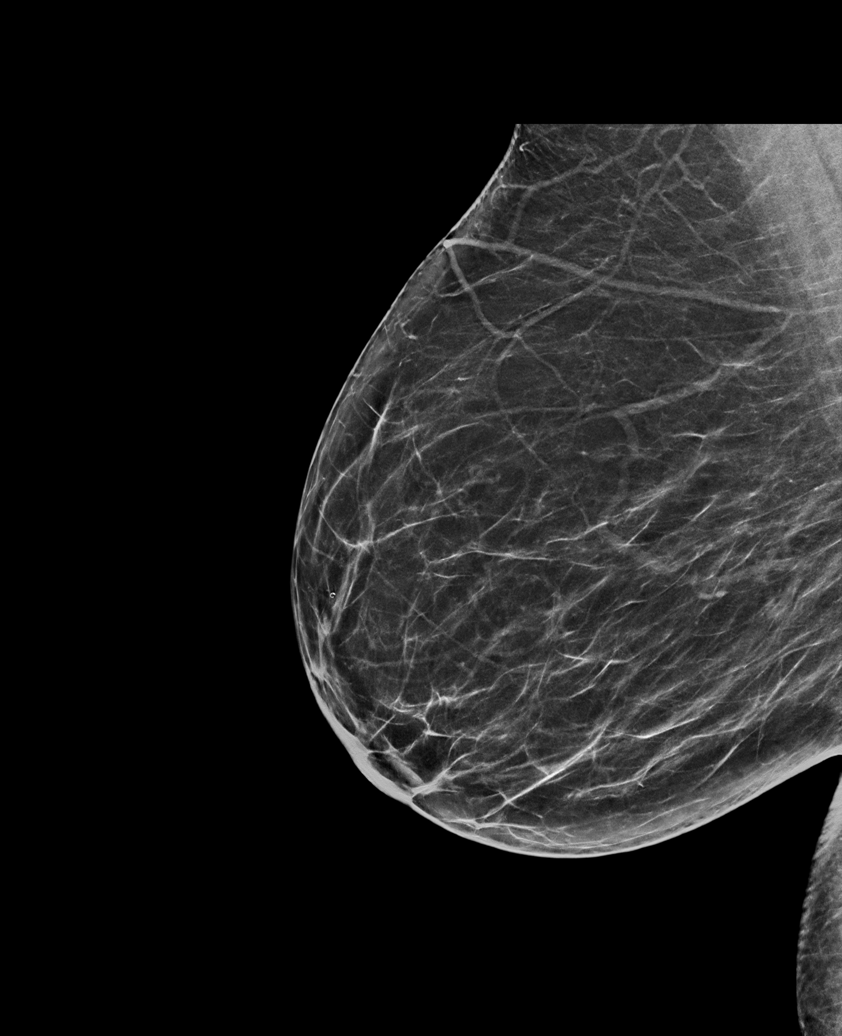

[6 of 36 positions shown; findings below may reference images not displayed]

ACR Breast Density Category b: There are scattered areas of
fibroglandular density.
FINDINGS: There are no findings suspicious for malignancy. Images were
processed with CAD.
IMPRESSION: No mammographic evidence of malignancy. A result letter of this
screening mammogram will be mailed directly to the patient.

RECOMMENDATION:
Screening mammogram in one year. (Code:[id number])

BI-RADS CATEGORY  1: Negative.

## 2020-09-16 ENCOUNTER — Encounter: Payer: Self-pay | Admitting: Adult Health

## 2020-09-16 ENCOUNTER — Ambulatory Visit: Payer: Managed Care, Other (non HMO) | Admitting: Adult Health

## 2020-09-16 ENCOUNTER — Telehealth: Payer: Self-pay

## 2020-09-16 NOTE — Telephone Encounter (Signed)
LVM, No cpap data, called to see if pt is still using cpap machine

## 2020-09-24 NOTE — Progress Notes (Signed)
Cardiology Office Note  Date: 09/25/2020   ID: Jade Bradley, DOB 08-02-1961, MRN [id number]  PCP:  Sharilyn Sites, MD  Cardiologist:  No primary care provider on file. Electrophysiologist:  None   Chief Complaint: Follow-up atrial fibrillation with RVR  History of Present Illness: Jade Bradley is a 60 y.o. female with a history of A. fib with RVR.  Last seen by Dr. Bronson Ing on 10/31/2019: She was found to be tachycardic on physical exam with a regular rhythm on 10/10/2019.  EKG was reportedly obtained at PCP office stating she appeared to be in rapid atrial fibrillation.  She was a started on Toprol-XL and Xarelto.  She had been experiencing palpitations for least a month prior to presenting to her PCP.  She had episodic chest pressure both at rest and with exertion.  Had occasional dizziness but no syncope.  She denied bleeding issues since being on Xarelto. History of snoring.  Has a stressful job.  Toprol was increased to 50 mg daily.  Echocardiogram was ordered.  Discussion of possible DC cardioversion if she remained symptomatic in spite of adequate heart rate control.  BP was well controlled on current therapy.  Sleep disordered breathing.  She is obese and has hypertension and new onset atrial fibrillation.  Long history of snoring, high risk for obstructive sleep apnea.   At last visit she stated she had been doing okay since Toprol dosage was increased.  Noticing less palpitations and increased heart rates.  Blood pressure had improved since increasing dose of losartan.  EKG showed heart rate of 80 with atrial fibrillation.  She had been stressed out recently.  Her husband was recently diagnosed with cancer.  She brought with her her a log of blood pressures which appeared to be well controlled in the 124P to 809X systolic.  Patient states she was using her CPAP and seemed to be tolerating it okay but mentioned it may need some adjustment.  She is here for 34-month follow-up  today states she has been doing reasonably well without any palpitations or arrhythmias. Her blood pressure is elevated today on arrival. She states her blood pressure at home has been running in the 135-145 range with diastolics running in the 83J to 90s. She denies any orthostatic symptoms, CVA or TIA-like symptoms, PND, orthopnea,. Denies any bleeding issues, claudication, DVT or PE-like symptoms, lower extremity edema.   Past Medical History:  Diagnosis Date  . Anxiety   . Atrial fibrillation ([place])   . Hypercholesteremia   . Seasonal allergies     Past Surgical History:  Procedure Laterality Date  . CHOLECYSTECTOMY    . COLONOSCOPY N/A 04/07/2014   Procedure: COLONOSCOPY;  Surgeon: Danie Binder, MD;  [facility];  Service: Endoscopy;  Laterality: N/A;  8:30 AM  . RIGHT/LEFT HEART CATH AND CORONARY ANGIOGRAPHY N/A 12/03/2019   Procedure: RIGHT/LEFT HEART CATH AND CORONARY ANGIOGRAPHY;  Surgeon: Burnell Blanks, MD;  Location: [facility];  Service: Cardiovascular;  Laterality: N/A;    Current Outpatient Medications  Medication Sig Dispense Refill  . losartan (COZAAR) 25 MG tablet TAKE 1 TABLET BY MOUTH EVERY DAY 90 tablet 1  . metoprolol succinate (TOPROL-XL) 50 MG 24 hr tablet TAKE 1 TABLET BY MOUTH EVERY DAY 15 tablet 0  . pravastatin (PRAVACHOL) 20 MG tablet Take 20 mg by mouth daily.    . rivaroxaban (XARELTO) 20 MG TABS tablet Take 1 tablet (20 mg total) by mouth daily with supper.  30 tablet 6   No current facility-administered medications for this visit.   Allergies:  Patient has no known allergies.   Social History: The patient  reports that she has been smoking cigarettes. She started smoking about 45 years ago. She has smoked for the past 48.00 years. She has never used smokeless tobacco. She reports that she does not drink alcohol and does not use drugs.   Family History: The patient's family history includes CAD in her father and mother;  Diabetes in her father; Heart attack in her father and mother; Hypertension in her father.   ROS:  Please see the history of present illness. Otherwise, complete review of systems is positive for none.  All other systems are reviewed and negative.   Physical Exam: VS:  BP (!) 152/88   Pulse 84   Ht $R'5\' 8"'Lh$  (1.727 m)   Wt 227 lb 6.4 oz (103.1 kg)   LMP 08/11/2011   SpO2 97%   BMI 34.58 kg/m , BMI Body mass index is 34.58 kg/m.  Wt Readings from Last 3 Encounters:  09/25/20 227 lb 6.4 oz (103.1 kg)  03/12/20 228 lb (103.4 kg)  02/18/20 215 lb 12.8 oz (97.9 kg)    General: Patient appears comfortable at rest. Neck: Supple, no elevated JVP or carotid bruits, no thyromegaly. Lungs: Clear to auscultation, nonlabored breathing at rest. Cardiac: Irregularly irregular rate and rhythm, no S3 or significant systolic murmur, no pericardial rub. Abdomen: Soft, nontender, no hepatomegaly, bowel sounds present, no guarding or rebound. Extremities: No pitting edema, distal pulses 2+. Skin: Warm and dry. Musculoskeletal: No kyphosis. Neuropsychiatric: Alert and oriented x3, affect grossly appropriate.  ECG:  An ECG dated 02/18/2020 was personally reviewed today and demonstrated:  Atrial fibrillation rate of 80  Recent Labwork: 12/02/2019: Magnesium 2.2 01/26/2020: ALT 13; AST 13; BUN 19; Creatinine, Ser 0.73; Hemoglobin 13.4; Platelets 189; Potassium 3.7; Sodium 140     Component Value Date/Time   CHOL 137 12/01/2019 1459   TRIG 115 12/01/2019 1459   HDL 35 (L) 12/01/2019 1459   CHOLHDL 3.9 12/01/2019 1459   VLDL 23 12/01/2019 1459   [place] 79 12/01/2019 1459    Other Studies Reviewed Today:  Home sleep study 12/11/2018  Summary & Diagnosis:   There was very mild OSA noted, at AHI of 6.9/h and in REM sleep  AHI was 12/h.  Loud snoring was recorded, primarily in supine position. There  were many intermittent short periods of hypoxemia, a heart rhythm  however cannot be documented on  HST.   Recommendations:    This REM dependent mild apnea is associated with upper airway  resistance syndrome (UARS) and intermittent hypoxia. I would  recommend CPAP treatment based on the underlying atrial fib  condition.  Auto CPAP device will be set for a window of 6-15 cm water  pressure, heated humidity, and 2 cm EPR, with a mask of patient's    Cardiac catheterization 12/03/2019 1. No angiographic evidence of CAD 2. Normal right and left heart pressures.   Recommendations: No further ischemic workup. Resume Xarelto tomorrow. OK to discharge home today.    Echocardiogram 11/21/2019 IMPRESSIONS  1. Global hypokinesis. More severe hypokinesis of the anteroseptal wall.  . Left ventricular ejection fraction, by estimation, is 35 to 40%. The  left ventricle has moderately decreased function. The left ventricle  demonstrates global hypokinesis. The  left ventricular internal cavity size was mildly dilated. Left ventricular  diastolic parameters are indeterminate.  2. Right ventricular systolic function  is normal. The right ventricular  size is normal. There is normal pulmonary artery systolic pressure.  3. Left atrial size was severely dilated.  4. Right atrial size was mildly dilated.  5. The mitral valve is normal in structure. Trivial mitral valve  regurgitation. No evidence of mitral stenosis.  6. The aortic valve is tricuspid. Aortic valve regurgitation is not  visualized. No aortic stenosis is present.  7. The inferior vena cava is normal in size with greater than 50%  respiratory variability, suggesting right atrial pressure of 3 mmHg.  8. Probably small PFO with mild left to right shunt.  Assessment and Plan:   1. Paroxysmal atrial fibrillation (H[facility]) Heart rate is controlled with rate of 84 today. Atrial fibrillation remains.  Continue Toprol-XL 50 mg p.o. daily.  Continue Xarelto 20 mg daily.  2. Non-ischemic cardiomyopathy ([place]) Recent echocardiogram  11/21/2019 EF 35 to 40%.  Global hypokinesis.  More severe hypokinesis of anteroseptal wall.  Severe LA dilation, mild RA dilation, trivial MR, probable small PFO with mild left-to-right shunt.  Denies any clinical heart failure-like symptoms   3. OSA (obstructive sleep apnea) Patient states she is compliant with CPAP but the mask makes her feel claustrophobic at times as if she cannot breathe. At last visit she stated she was going to follow-up with pulmonary in the near future for possible adjustment.  4.  Hyperlipidemia Recent lipid profile showed a total cholesterol 137, triglycerides 117, HDL 35, LDL 79.  Continue pravastatin 20 mg daily  5.  Hypertension States her blood pressure at home has been running in the 334-356 range systolic and diastolic blood pressures have been running in the 80s to 90s. Will increase losartan to 50 mg for better blood pressure control. Get a follow-up be met in 10 days after starting increased dose of losartan.  6.  Smoking Patient states she has been cutting down on her smoking.  However she states she is replacing smoking with eating and has gained some weight. Advised cessation.   Medication Adjustments/Labs and Tests Ordered: Current medicines are reviewed at length with the patient today.  Concerns regarding medicines are outlined above.   Disposition: Follow-up with Dr. Dr. Harl Bowie or APP 6 months   signed, Levell July, NP 09/25/2020 11:13 AM    [facility] [place], [place], [place] [address] Phone: [phone]; Fax: [phone]

## 2020-09-25 ENCOUNTER — Encounter: Payer: Self-pay | Admitting: Family Medicine

## 2020-09-25 ENCOUNTER — Ambulatory Visit (INDEPENDENT_AMBULATORY_CARE_PROVIDER_SITE_OTHER): Payer: Managed Care, Other (non HMO) | Admitting: Family Medicine

## 2020-09-25 ENCOUNTER — Other Ambulatory Visit: Payer: Self-pay

## 2020-09-25 VITALS — BP 152/88 | HR 84 | Ht 68.0 in | Wt 227.4 lb

## 2020-09-25 DIAGNOSIS — I10 Essential (primary) hypertension: Secondary | ICD-10-CM

## 2020-09-25 MED ORDER — LOSARTAN POTASSIUM 50 MG PO TABS: 50.0000 mg | ORAL_TABLET | Freq: Every day | ORAL | 6 refills | Status: DC

## 2020-09-25 NOTE — Patient Instructions (Addendum)
Medication Instructions:   Increase Losartan to 50mg  daily.   Continue all other medications.    Labwork:  BMET, Mg - orders given today.   Please do in approximately 10 days.   Office will contact with results via phone or letter.    Testing/Procedures: none  Follow-Up: 6 months   Any Other Special Instructions Will Be Listed Below (If Applicable).  If you need a refill on your cardiac medications before your next appointment, please call your pharmacy.

## 2020-09-30 LAB — BASIC METABOLIC PANEL
BUN: 12 mg/dL (ref 7–25)
CO2: 26 mmol/L (ref 20–32)
Calcium: 9.3 mg/dL (ref 8.6–10.4)
Chloride: 106 mmol/L (ref 98–110)
Creat: 0.71 mg/dL (ref 0.50–1.05)
Glucose, Bld: 94 mg/dL (ref 65–99)
Potassium: 4.2 mmol/L (ref 3.5–5.3)
Sodium: 140 mmol/L (ref 135–146)

## 2020-09-30 LAB — MAGNESIUM: Magnesium: 2.1 mg/dL (ref 1.5–2.5)

## 2020-10-01 ENCOUNTER — Telehealth: Payer: Self-pay | Admitting: *Deleted

## 2020-10-01 NOTE — Telephone Encounter (Signed)
-----   Message from Verta Ellen., NP sent at 10/01/2020  7:54 AM EST ----- Please call the patient and let her know all of her lab work was normal.  Everything looked good

## 2020-10-01 NOTE — Telephone Encounter (Signed)
Laurine Blazer, LPN  4/40/3474 2:59 PM EST Back to Top     Notified, copy to pcp.

## 2020-10-18 ENCOUNTER — Other Ambulatory Visit: Payer: Self-pay | Admitting: Family Medicine

## 2020-11-01 IMAGING — DX DG CHEST 2V
2 series · 2 of 2 positions shown · non-contrast
Comparison: August 10, 2011

CLINICAL DATA: Chest pain.  Shortness of breath.

EXAM:
CHEST - 2 VIEW

[chest pa]
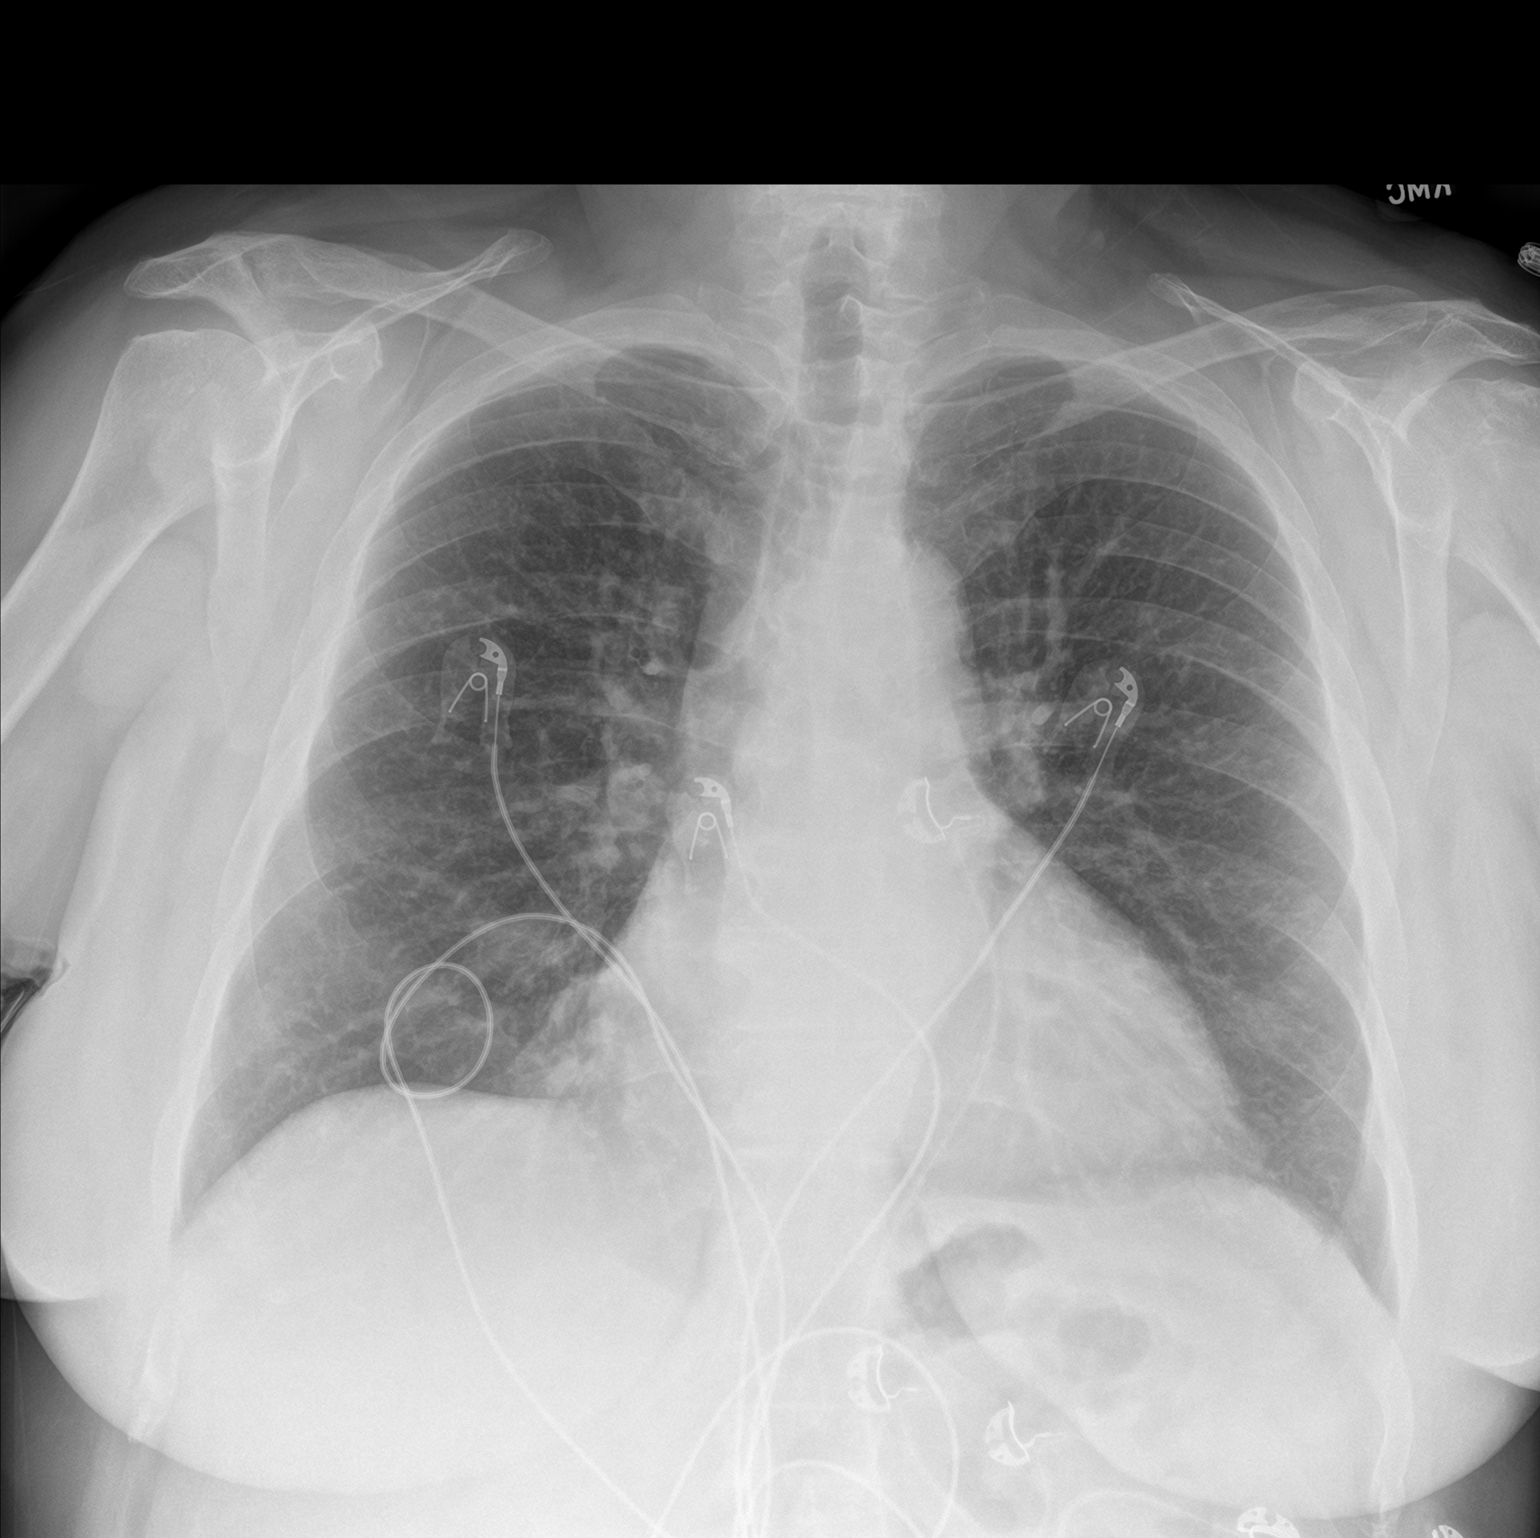

[chest lat]
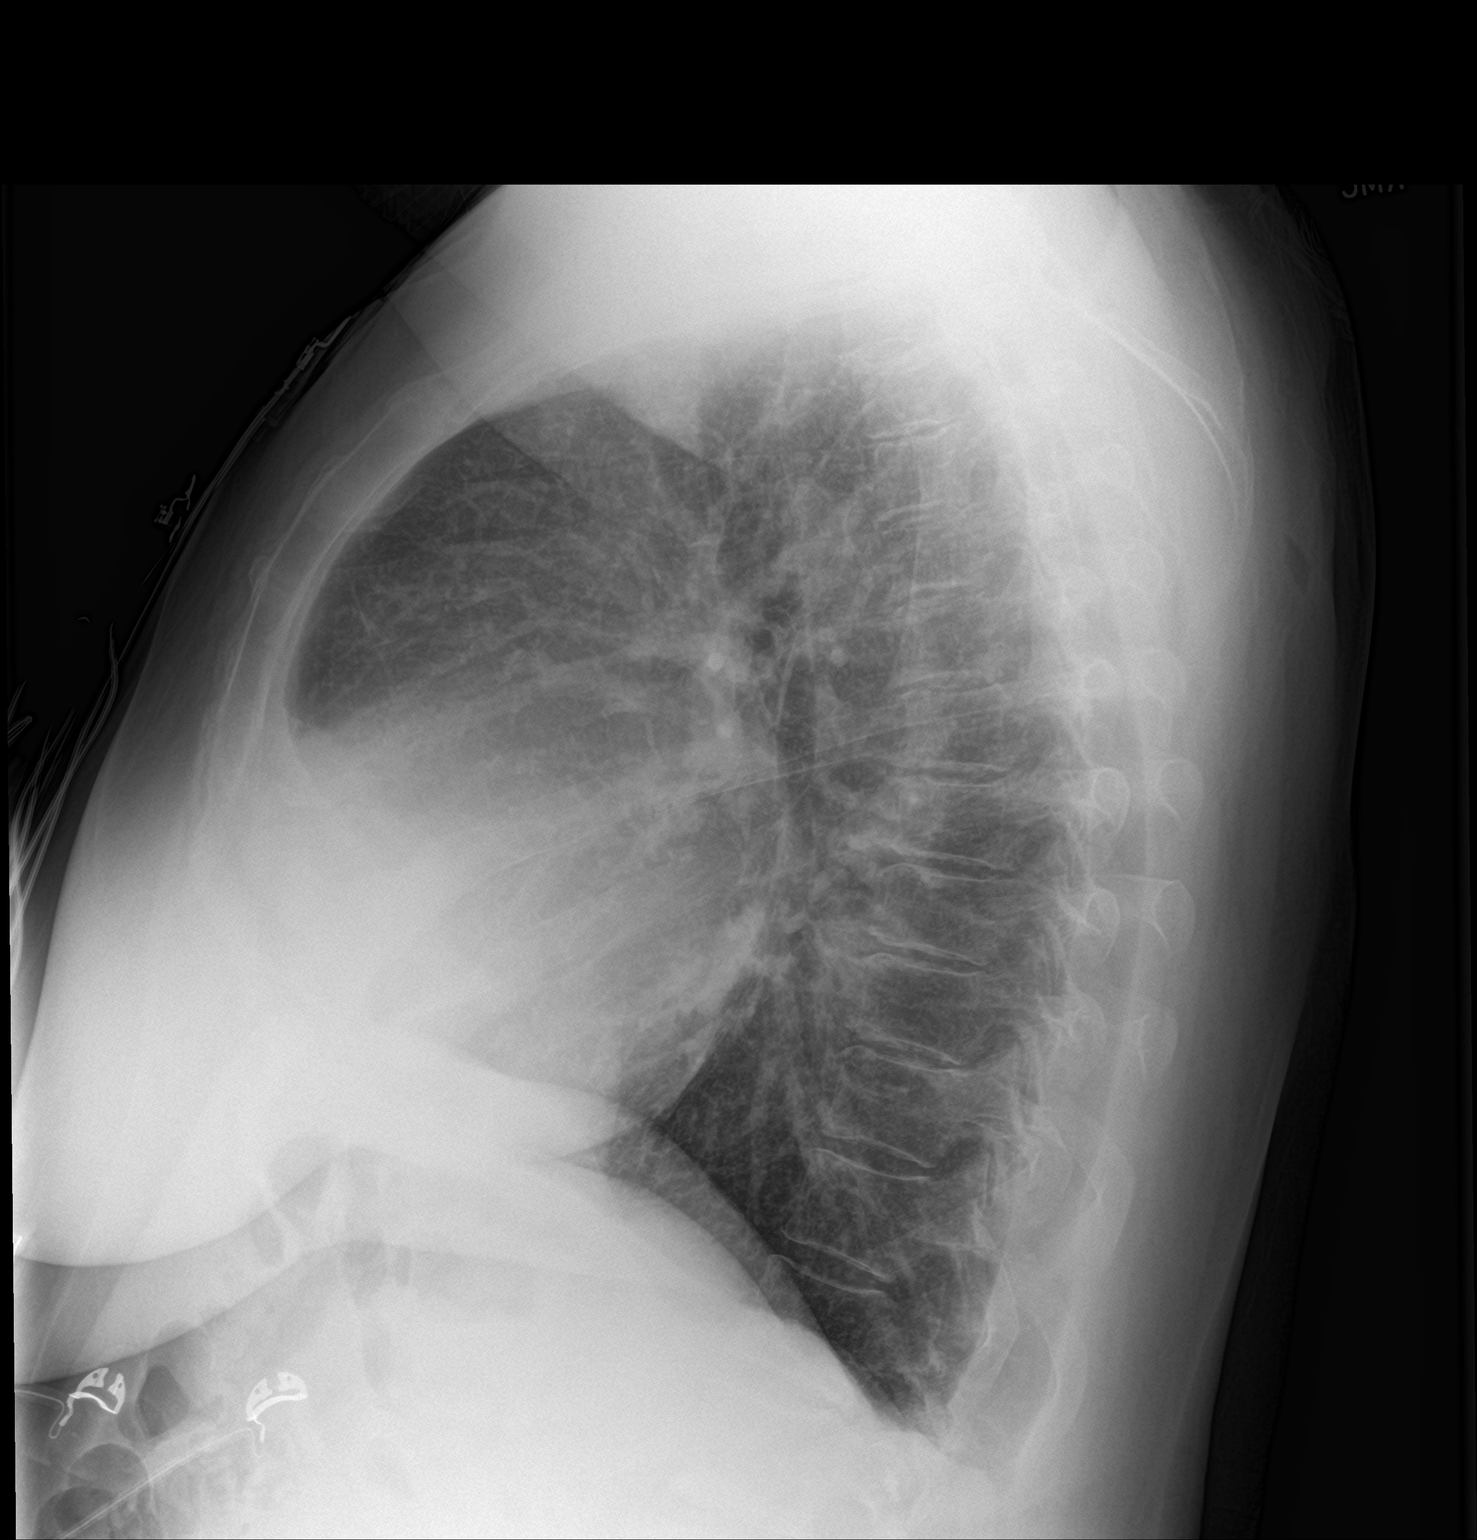

[2 of 2 positions shown; findings below may reference images not displayed]

FINDINGS: Stable mild cardiomegaly. The hila, mediastinum, lungs, and pleura
are otherwise unremarkable.
IMPRESSION: No active cardiopulmonary disease.

## 2020-12-23 ENCOUNTER — Other Ambulatory Visit: Payer: Self-pay | Admitting: Family Medicine

## 2020-12-23 NOTE — Telephone Encounter (Signed)
Prescription refill request for Xarelto received.  Indication: PAF Last office visit: 09/25/20 Weight: 103.1kg Age: 60 Scr: 0.71 CrCl: 142.29  Based on above findings Xarelto 20mg  daily is the appropriate dose.  Refill approved.

## 2021-03-26 ENCOUNTER — Other Ambulatory Visit: Payer: Self-pay | Admitting: Family Medicine

## 2021-03-28 NOTE — Progress Notes (Signed)
Cardiology Office Note  Date: 03/29/2021   ID: Jade Bradley, Jade Bradley October 04, 1960, MRN [id number]  PCP:  Sharilyn Sites, MD  Cardiologist:  None Electrophysiologist:  None   Chief Complaint: Follow-up atrial fibrillation with RVR  History of Present Illness: Jade Bradley is a 60 y.o. female with a history of A. fib with RVR.  Last seen by Dr. Bronson Ing on 10/31/2019: She was found to be tachycardic on physical exam with a regular rhythm on 10/10/2019.  EKG was reportedly obtained at PCP office stating she appeared to be in rapid atrial fibrillation.  She was a started on Toprol-XL and Xarelto.  She had been experiencing palpitations for least a month prior to presenting to her PCP.  She had episodic chest pressure both at rest and with exertion.  Had occasional dizziness but no syncope.  She denied bleeding issues since being on Xarelto. History of snoring.  Has a stressful job.  Toprol was increased to 50 mg daily.  Echocardiogram was ordered.  Discussion of possible DC cardioversion if she remained symptomatic in spite of adequate heart rate control.  BP was well controlled on current therapy.  Sleep disordered breathing.  She is obese and has hypertension and new onset atrial fibrillation.  Long history of snoring, high risk for obstructive sleep apnea.   At last visit she stated she had been doing okay since Toprol dosage was increased.  Noticing less palpitations and increased heart rates.  Blood pressure had improved since increasing dose of losartan.  EKG showed heart rate of 80 with atrial fibrillation.  She had been stressed out recently.  Her husband was recently diagnosed with cancer.  She brought with her her a log of blood pressures which appeared to be well controlled in the [id number] systolic.  Patient states she was using her CPAP and seemed to be tolerating it okay but mentioned it may need some adjustment.  She is here for 52-monthfollow-up.  Blood pressure is elevated on  arrival at 152/106.  She states at home usually the top number is in the 150s and bottom number is in the 793sconsistently.  She admits to continued smoking but states a pack usually lasts her about 3 days.  States she has not been consistently wearing her CPAP.  She denies any significant palpitations or arrhythmias.  Heart rate is well controlled today with EKG demonstrating atrial fibrillation with a rate of 76, nonspecific ST abnormality.  Weight has been relatively stable.  Previous weight was 227 in February.  Today's weight is 229.  She denies any lower extremity edema, DOE/SOB.  No anginal symptoms.  No CVA or TIA-like symptoms.  No bleeding issues.  No claudication-like symptoms, DVT or PE-like symptoms.   Past Medical History:  Diagnosis Date   Anxiety    Atrial fibrillation ([place]    Hypercholesteremia    Seasonal allergies     Past Surgical History:  Procedure Laterality Date   CHOLECYSTECTOMY     COLONOSCOPY N/A 04/07/2014   Procedure: COLONOSCOPY;  Surgeon: SDanie Binder MD;  [facility];  Service: Endoscopy;  Laterality: N/A;  8:30 AM   RIGHT/LEFT HEART CATH AND CORONARY ANGIOGRAPHY N/A 12/03/2019   Procedure: RIGHT/LEFT HEART CATH AND CORONARY ANGIOGRAPHY;  Surgeon: MBurnell Blanks MD;  [facility];  Service: Cardiovascular;  Laterality: N/A;    Current Outpatient Medications  Medication Sig Dispense Refill   losartan (COZAAR) 50 MG tablet TAKE 1 TABLET  BY MOUTH EVERY DAY 90 tablet 2   pravastatin (PRAVACHOL) 20 MG tablet Take 20 mg by mouth daily.     XARELTO 20 MG TABS tablet TAKE 1 TABLET (20 MG TOTAL) BY MOUTH DAILY WITH SUPPER. 90 tablet 2   metoprolol succinate (TOPROL-XL) 50 MG 24 hr tablet Take 1.5 tablets (75 mg total) by mouth daily. 45 tablet 6   No current facility-administered medications for this visit.   Allergies:  Patient has no known allergies.   Social History: The patient  reports that she has been smoking  cigarettes. She started smoking about 46 years ago. She has never used smokeless tobacco. She reports that she does not drink alcohol and does not use drugs.   Family History: The patient's family history includes CAD in her father and mother; Diabetes in her father; Heart attack in her father and mother; Hypertension in her father.   ROS:  Please see the history of present illness. Otherwise, complete review of systems is positive for none.  All other systems are reviewed and negative.   Physical Exam: VS:  BP (!) 152/106   Pulse 88   Ht '5\' 8"'$  (1.727 m)   Wt 229 lb (103.9 kg)   LMP 08/11/2011   SpO2 98%   BMI 34.82 kg/m , BMI Body mass index is 34.82 kg/m.  Wt Readings from Last 3 Encounters:  03/29/21 229 lb (103.9 kg)  09/25/20 227 lb 6.4 oz (103.1 kg)  03/12/20 228 lb (103.4 kg)    General: Patient appears comfortable at rest. Neck: Supple, no elevated JVP or carotid bruits, no thyromegaly. Lungs: Clear to auscultation, nonlabored breathing at rest. Cardiac: Irregularly irregular rate and rhythm, no S3 or significant systolic murmur, no pericardial rub. Extremities: No pitting edema, distal pulses 2+. Skin: Warm and dry. Musculoskeletal: No kyphosis. Neuropsychiatric: Alert and oriented x3, affect grossly appropriate.  ECG:  An ECG dated 02/18/2020 was personally reviewed today and demonstrated:  Atrial fibrillation rate of 80  Recent Labwork: 09/30/2020: BUN 12; Creat 0.71; Magnesium 2.1; Potassium 4.2; Sodium 140     Component Value Date/Time   CHOL 137 12/01/2019 1459   TRIG 115 12/01/2019 1459   HDL 35 (L) 12/01/2019 1459   CHOLHDL 3.9 12/01/2019 1459   VLDL 23 12/01/2019 1459   [place] 79 12/01/2019 1459    Other Studies Reviewed Today:  Home sleep study 12/11/2018  Summary & Diagnosis:     There was very mild OSA noted, at AHI of 6.9/h and in REM sleep  AHI was 12/h.  Loud snoring was recorded, primarily in supine position. There  were many intermittent short  periods of hypoxemia, a heart rhythm  however cannot be documented on HST.   Recommendations:      This REM dependent mild apnea is associated with upper airway  resistance syndrome (UARS) and intermittent hypoxia. I would  recommend CPAP treatment based on the underlying atrial fib  condition.  Auto CPAP device will be set for a window of 6-15 cm water  pressure, heated humidity, and 2 cm EPR, with a mask of patient's    Cardiac catheterization 12/03/2019 1. No angiographic evidence of CAD 2. Normal right and left heart pressures.    Recommendations: No further ischemic workup. Resume Xarelto tomorrow. OK to discharge home today.     Echocardiogram 11/21/2019 IMPRESSIONS   1. Global hypokinesis. More severe hypokinesis of the anteroseptal wall.  . Left ventricular ejection fraction, by estimation, is 35 to 40%. The  left  ventricle has moderately decreased function. The left ventricle  demonstrates global hypokinesis. The  left ventricular internal cavity size was mildly dilated. Left ventricular  diastolic parameters are indeterminate.   2. Right ventricular systolic function is normal. The right ventricular  size is normal. There is normal pulmonary artery systolic pressure.   3. Left atrial size was severely dilated.   4. Right atrial size was mildly dilated.   5. The mitral valve is normal in structure. Trivial mitral valve  regurgitation. No evidence of mitral stenosis.   6. The aortic valve is tricuspid. Aortic valve regurgitation is not  visualized. No aortic stenosis is present.   7. The inferior vena cava is normal in size with greater than 50%  respiratory variability, suggesting right atrial pressure of 3 mmHg.   8. Probably small PFO with mild left to right shunt.  Assessment and Plan:   1. Paroxysmal atrial fibrillation (HCC) EKG today shows atrial fibrillation with a rate of 76, nonspecific ST abnormality.  Continue Toprol-XL 50 mg p.o. daily.  Continue Xarelto  20 mg daily.  Denies any bleeding on Xarelto.  2. Non-ischemic cardiomyopathy ([place]) Recent echocardiogram 11/21/2019 EF 35 to 40%.  Global hypokinesis.  More severe hypokinesis of anteroseptal wall.  Severe LA dilation, mild RA dilation, trivial MR, probable small PFO with mild left-to-right shunt.  Denies any clinical heart failure-like symptoms please schedule follow-up echocardiogram to reassess LV function, diastolic function and valvular function.  3. OSA (obstructive sleep apnea) She states today she has not been totally compliant with CPAP therapy.  At last visit she stated she was going to follow-up with pulmonary in the near future for possible adjustment.  4.  Hyperlipidemia Recent lipid profile showed a total cholesterol 137, triglycerides 117, HDL 35, LDL 79.  Continue pravastatin 20 mg daily  5.  Hypertension Blood pressure elevated today at 152/106.  She states at home her blood pressure usually runs in the 150s over 70s.  Will increase Toprol to 75 mg daily.  Continue losartan at 50 mg daily.  6.  Smoking Patient states she has been cutting down on her smoking.  She states she can make a pack of cigarettes last for 3 days. Advised cessation.   Medication Adjustments/Labs and Tests Ordered: Current medicines are reviewed at length with the patient today.  Concerns regarding medicines are outlined above.   Disposition: Follow-up with Dr. Dr. Harl Bowie or APP 6 months   signed, Levell July, NP 03/29/2021 11:25 AM    [facility] [place], [place], [place] [address] Phone: [phone]; Fax: [phone]

## 2021-03-29 ENCOUNTER — Encounter: Payer: Self-pay | Admitting: Family Medicine

## 2021-03-29 ENCOUNTER — Ambulatory Visit (INDEPENDENT_AMBULATORY_CARE_PROVIDER_SITE_OTHER): Payer: Managed Care, Other (non HMO) | Admitting: Family Medicine

## 2021-03-29 VITALS — BP 152/106 | HR 88 | Ht 68.0 in | Wt 229.0 lb

## 2021-03-29 DIAGNOSIS — G4733 Obstructive sleep apnea (adult) (pediatric): Secondary | ICD-10-CM

## 2021-03-29 DIAGNOSIS — I480 Paroxysmal atrial fibrillation: Secondary | ICD-10-CM | POA: Diagnosis not present

## 2021-03-29 DIAGNOSIS — E782 Mixed hyperlipidemia: Secondary | ICD-10-CM

## 2021-03-29 DIAGNOSIS — I428 Other cardiomyopathies: Secondary | ICD-10-CM

## 2021-03-29 DIAGNOSIS — F17200 Nicotine dependence, unspecified, uncomplicated: Secondary | ICD-10-CM

## 2021-03-29 MED ORDER — METOPROLOL SUCCINATE ER 50 MG PO TB24: 75.0000 mg | ORAL_TABLET | Freq: Every day | ORAL | 6 refills | Status: DC

## 2021-03-29 NOTE — Patient Instructions (Addendum)
Medication Instructions:  Increase Toprol XL to '75mg'$  daily.  Continue all other medications.     Labwork: none  Testing/Procedures: Your physician has requested that you have an echocardiogram. Echocardiography is a painless test that uses sound waves to create images of your heart. It provides your doctor with information about the size and shape of your heart and how well your heart's chambers and valves are working. This procedure takes approximately one hour. There are no restrictions for this procedure. Office will contact with results via phone or letter.     Follow-Up: 6 months   Any Other Special Instructions Will Be Listed Below (If Applicable).    If you need a refill on your cardiac medications before your next appointment, please call your pharmacy.

## 2021-04-21 ENCOUNTER — Ambulatory Visit (HOSPITAL_COMMUNITY)
Admission: RE | Admit: 2021-04-21 | Discharge: 2021-04-21 | Disposition: A | Discharge status: Home / Self Care | Payer: Managed Care, Other (non HMO) | Source: Ambulatory Visit | Attending: Family Medicine | Admitting: Family Medicine

## 2021-04-21 ENCOUNTER — Other Ambulatory Visit: Payer: Self-pay

## 2021-04-21 DIAGNOSIS — I428 Other cardiomyopathies: Secondary | ICD-10-CM | POA: Insufficient documentation

## 2021-04-21 LAB — ECHOCARDIOGRAM COMPLETE
Area-P 1/2: 4.49 cm2
S' Lateral: 4.3 cm

## 2021-04-21 NOTE — Progress Notes (Signed)
*  PRELIMINARY RESULTS* Echocardiogram 2D Echocardiogram has been performed.  Samuel Germany 04/21/2021, 12:44 PM

## 2021-04-27 ENCOUNTER — Telehealth: Payer: Self-pay | Admitting: *Deleted

## 2021-04-27 NOTE — Telephone Encounter (Signed)
-----   Message from Verta Ellen., NP sent at 04/21/2021  7:27 PM EDT ----- Please call the patient and let her know the pumping function of the heart has improved some since the last echocardiogram. Previous echo showed EF of 35-40 %. This echo showed improved pumping function of 40-45%. ( Normal is 50-60% range) The main pumping chamber is mildly more muscular than normal. Best management is to keep BP at or below 130/80 consistently and manage all other risk factors.  Verta Ellen, NP  04/21/2021 7:20 PM

## 2021-04-27 NOTE — Telephone Encounter (Signed)
Laurine Blazer, LPN  2/94/7654  6:50 PM EDT Back to Top    Notified, copy to pcp.

## 2021-09-16 ENCOUNTER — Other Ambulatory Visit: Payer: Self-pay

## 2021-09-16 MED ORDER — METOPROLOL SUCCINATE ER 50 MG PO TB24: 75.0000 mg | ORAL_TABLET | Freq: Every day | ORAL | 6 refills | Status: DC

## 2021-09-20 ENCOUNTER — Other Ambulatory Visit: Payer: Self-pay

## 2021-09-20 MED ORDER — RIVAROXABAN 20 MG PO TABS: 20.0000 mg | ORAL_TABLET | Freq: Every day | ORAL | 1 refills | Status: DC

## 2021-09-20 NOTE — Telephone Encounter (Signed)
Prescription refill request for Xarelto received.  Indication: Afib  Last office visit:03/29/21 Leonides Sake)  Weight: 103.9kg Age: 61 Scr: 0.71 (09/30/20)  CrCl: 141.2ml/min  Appropriate dose and refill sent to requested pharmacy.

## 2021-11-08 ENCOUNTER — Encounter: Payer: Self-pay | Admitting: Cardiology

## 2021-11-08 NOTE — Progress Notes (Signed)
? ? ?Cardiology Office Note ? ?Date: 11/09/2021  ? ?ID: Jade Bradley, DOB [birth year], MRN [id number] ? ?PCP:  Sharilyn Sites, MD  ?Cardiologist:  Rozann Lesches, MD ?Electrophysiologist:  None  ? ?Chief Complaint  ?Patient presents with  ? Cardiac follow-up  ? ? ?History of Present Illness: ?Jade Bradley is a 61 y.o. female former patient of Dr. Bronson Ing now presenting to establish follow-up with me.  I reviewed her records and updated the chart.  She was last seen by Mr. Leonides Sake NP in August 2022. ? ?She is here for a follow-up visit.  She reports NYHA class I-II dyspnea, occasional sense of palpitations, no exertional chest pain.  She works 8-hour shifts at [employer].  She is on her feet most of the time.  Does not report any leg swelling or significant weight change. ? ?I reviewed her medications which are noted below.  She did run out of Pravachol.  She reports compliance with Cozaar, Toprol-XL, and Xarelto.  She has not had follow-up with PCP or lab work recently. ? ?Last echocardiogram was in September 2022 at which point LVEF had improved to the range of 40 to 45%.  She has a history of nonischemic cardiomyopathy with normal coronaries in 2021.  Also paroxysmal to persistent atrial fibrillation. ? ?Past Medical History:  ?Diagnosis Date  ? Anxiety   ? Atrial fibrillation ([place])   ? Essential hypertension   ? Mixed hyperlipidemia   ? Nonischemic cardiomyopathy ([place])   ? OSA (obstructive sleep apnea)   ? Seasonal allergies   ? ? ?Past Surgical History:  ?Procedure Laterality Date  ? CHOLECYSTECTOMY    ? COLONOSCOPY N/A 04/07/2014  ? Procedure: COLONOSCOPY;  Surgeon: Danie Binder, MD;  [facility];  Service: Endoscopy;  Laterality: N/A;  8:30 AM  ? RIGHT/LEFT HEART CATH AND CORONARY ANGIOGRAPHY N/A 12/03/2019  ? Procedure: RIGHT/LEFT HEART CATH AND CORONARY ANGIOGRAPHY;  Surgeon: Burnell Blanks, MD;  Location: [place];  Service: Cardiovascular;  Laterality: N/A;   ? ? ?Current Outpatient Medications  ?Medication Sig Dispense Refill  ? losartan (COZAAR) 50 MG tablet TAKE 1 TABLET BY MOUTH EVERY DAY 90 tablet 2  ? metoprolol succinate (TOPROL-XL) 100 MG 24 hr tablet Take 1 tablet (100 mg total) by mouth daily. Take with or immediately following a meal. 90 tablet 3  ? pravastatin (PRAVACHOL) 20 MG tablet Take 20 mg by mouth daily.    ? rivaroxaban (XARELTO) 20 MG TABS tablet Take 1 tablet (20 mg total) by mouth daily with supper. 90 tablet 1  ? ?No current facility-administered medications for this visit.  ? ?Allergies:  Patient has no known allergies.  ? ?Social History: The patient  reports that she has been smoking cigarettes. She started smoking about 46 years ago. She has never used smokeless tobacco. She reports that she does not drink alcohol and does not use drugs.  ? ?Family History: The patient's family history includes CAD in her father and mother; Diabetes in her father; Heart attack in her father and mother; Hypertension in her father.  ? ?ROS: No orthopnea or PND.  No syncope. ? ?Physical Exam: ?VS:  BP (!) 162/90   Pulse 100   Ht '5\' 8"'$  (1.727 m)   Wt 228 lb 9.6 oz (103.7 kg)   LMP 08/11/2011   SpO2 98%   BMI 34.76 kg/m? , BMI Body mass index is 34.76 kg/m?. ? ?Wt Readings from Last 3 Encounters:  ?11/09/21 228 lb  9.6 oz (103.7 kg)  ?03/29/21 229 lb (103.9 kg)  ?09/25/20 227 lb 6.4 oz (103.1 kg)  ?  ?General: Patient appears comfortable at rest. ?HEENT: Conjunctiva and lids normal. ?Neck: Supple, no elevated JVP or carotid bruits. ?Lungs: Clear to auscultation, nonlabored breathing at rest. ?Cardiac: Irregularly irregular, no S3 or significant systolic murmur, no pericardial rub. ?Abdomen: Soft, nontender, bowel sounds present. ?Extremities: No pitting edema, distal pulses 2+. ?Skin: Warm and dry. ?Musculoskeletal: No kyphosis. ?Neuropsychiatric: Alert and oriented x3, affect grossly appropriate. ? ?ECG:  An ECG dated 03/30/2019 was personally reviewed today  and demonstrated:  Atrial fibrillation with nonspecific ST changes. ? ?Recent Labwork: ?   ?Component Value Date/Time  ? CHOL 137 12/01/2019 1459  ? TRIG 115 12/01/2019 1459  ? HDL 35 (L) 12/01/2019 1459  ? CHOLHDL 3.9 12/01/2019 1459  ? VLDL 23 12/01/2019 1459  ? [place] 79 12/01/2019 1459  ?February 2022: BUN 12, creatinine 0.71, potassium 4.2, magnesium 2.1 ? ?Other Studies Reviewed Today: ? ?Cardiac catheterization 12/03/2019: ?1. No angiographic evidence of CAD ?2. Normal right and left heart pressures.  ? ?Echocardiogram 04/21/2021: ? 1. Left ventricular ejection fraction, by estimation, is 40 to 45%. The  ?left ventricle has mildly decreased function. The left ventricle  ?demonstrates global hypokinesis. The left ventricular internal cavity size  ?was mildly dilated. There is mild left  ?ventricular hypertrophy. Left ventricular diastolic parameters are  ?indeterminate.  ? 2. Right ventricular systolic function is normal. The right ventricular  ?size is normal. Tricuspid regurgitation signal is inadequate for assessing  ?PA pressure.  ? 3. Left atrial size was mild to moderately dilated.  ? 4. Right atrial size was mild to moderately dilated.  ? 5. There is a trival pericardial effusion posterior to the left  ?ventricle.  ? 6. The mitral valve is grossly normal. No evidence of mitral valve  ?regurgitation.  ? 7. The aortic valve is tricuspid. Aortic valve regurgitation is not  ?visualized.  ? 8. The inferior vena cava is normal in size with greater than 50%  ?respiratory variability, suggesting right atrial pressure of 3 mmHg.  ? 9. Evidence of atrial level shunting detected by color flow Doppler.  ?There is a small patent foramen ovale with predominantly left to right  ?shunting across the atrial septum.  ? ?Assessment and Plan: ? ?1.  HFrEF with nonischemic cardiomyopathy, LVEF 40 to 45% by last assessment and normal RV contraction.  She reports NYHA class I-II dyspnea.  Beta-blocker was uptitrated at last  visit with persistent atrial fibrillation at baseline.  Resting heart rate is in the 90s today.  Plan to increase Toprol-XL to 100 mg a day, reduce likelihood of tachycardia induced cardiomyopathy in light of her history.  Check BMET and follow-up echocardiogram.  We will most likely switch from Cozaar to [id number], possibly add Aldactone thereafter. ? ?2.  Persistent atrial fibrillation with CHA2DS2-VASc score of 3.  She is on Xarelto for stroke prophylaxis.  Check CBC and also follow-up BMET.  Increasing Toprol-XL to 100 mg daily for better heart rate control. ? ?3.  OSA on CPAP. ? ?4.  Mixed hyperlipidemia.  She ran out of Pravachol.  We will check FLP and LFTs and decide on appropriate statin regimen.  Her last LDL was 79. ? ?Medication Adjustments/Labs and Tests Ordered: ?Current medicines are reviewed at length with the patient today.  Concerns regarding medicines are outlined above.  ? ?Tests Ordered: ?Orders Placed This Encounter  ?Procedures  ? Comprehensive metabolic panel  ?  CBC  ? Lipid panel  ? ECHOCARDIOGRAM COMPLETE  ? ? ?Medication Changes: ?Meds ordered this encounter  ?Medications  ? metoprolol succinate (TOPROL-XL) 100 MG 24 hr tablet  ?  Sig: Take 1 tablet (100 mg total) by mouth daily. Take with or immediately following a meal.  ?  Dispense:  90 tablet  ?  Refill:  3  ?  11/09/2021 dose increase  ? ? ?Disposition:  Follow up  6 months. ? ?Signed, ?Satira Sark, MD, [facility] ?11/09/2021 8:35 AM    ?[place] at [facility] ?[address], [place], [place] [address] ?Phone: [phone]; Fax: [phone]  ?

## 2021-11-09 ENCOUNTER — Encounter: Payer: Self-pay | Admitting: Cardiology

## 2021-11-09 ENCOUNTER — Ambulatory Visit (INDEPENDENT_AMBULATORY_CARE_PROVIDER_SITE_OTHER): Payer: Managed Care, Other (non HMO) | Admitting: Cardiology

## 2021-11-09 VITALS — BP 162/90 | HR 100 | Ht 68.0 in | Wt 228.6 lb

## 2021-11-09 DIAGNOSIS — Z79899 Other long term (current) drug therapy: Secondary | ICD-10-CM

## 2021-11-09 DIAGNOSIS — E782 Mixed hyperlipidemia: Secondary | ICD-10-CM

## 2021-11-09 DIAGNOSIS — I428 Other cardiomyopathies: Secondary | ICD-10-CM

## 2021-11-09 DIAGNOSIS — I4819 Other persistent atrial fibrillation: Secondary | ICD-10-CM

## 2021-11-09 DIAGNOSIS — I5020 Unspecified systolic (congestive) heart failure: Secondary | ICD-10-CM

## 2021-11-09 MED ORDER — METOPROLOL SUCCINATE ER 100 MG PO TB24: 100.0000 mg | ORAL_TABLET | Freq: Every day | ORAL | 3 refills | Status: DC

## 2021-11-09 NOTE — Patient Instructions (Addendum)
Medication Instructions:  ?Your physician has recommended you make the following change in your medication:  ?Increase metoprolol succinate to 100 mg daily ?Continue other medications the same ? ?Labwork: ?Your physician recommends that you return for a FASTING lipid profile, CMET & CBC within the next week. Please do not eat or drink for at least 8 hours when you have this done. You may take your medications that morning with a sip of water. ?[employer] or [employer] ? ?Testing/Procedures: ?Your physician has requested that you have an echocardiogram. Echocardiography is a painless test that uses sound waves to create images of your heart. It provides your doctor with information about the size and shape of your heart and how well your heart?s chambers and valves are working. This procedure takes approximately one hour. There are no restrictions for this procedure. ? ?Follow-Up: ?Your physician recommends that you schedule a follow-up appointment in: 6 months ? ?Any Other Special Instructions Will Be Listed Below (If Applicable). ? ?If you need a refill on your cardiac medications before your next appointment, please call your pharmacy. ?

## 2021-11-12 LAB — LIPID PANEL
Chol/HDL Ratio: 4.2 ratio (ref 0.0–4.4)
Cholesterol, Total: 181 mg/dL (ref 100–199)
HDL: 43 mg/dL (ref >39)
LDL Chol Calc (NIH): 121 mg/dL — ABNORMAL HIGH (ref 0–99)
Triglycerides: 91 mg/dL (ref 0–149)
VLDL Cholesterol Cal: 17 mg/dL (ref 5–40)

## 2021-11-12 LAB — COMPREHENSIVE METABOLIC PANEL
ALT: 12 IU/L (ref 0–32)
AST: 14 IU/L (ref 0–40)
Albumin/Globulin Ratio: 1.8 (ref 1.2–2.2)
Albumin: 4.1 g/dL (ref 3.8–4.9)
Alkaline Phosphatase: 106 IU/L (ref 44–121)
BUN/Creatinine Ratio: 18 (ref 9–23)
BUN: 13 mg/dL (ref 6–24)
Bilirubin Total: 0.3 mg/dL (ref 0.0–1.2)
CO2: 19 mmol/L — ABNORMAL LOW (ref 20–29)
Calcium: 8.7 mg/dL (ref 8.7–10.2)
Chloride: 106 mmol/L (ref 96–106)
Creatinine, Ser: 0.74 mg/dL (ref 0.57–1.00)
Globulin, Total: 2.3 g/dL (ref 1.5–4.5)
Glucose: 102 mg/dL — ABNORMAL HIGH (ref 70–99)
Potassium: 4.3 mmol/L (ref 3.5–5.2)
Sodium: 142 mmol/L (ref 134–144)
Total Protein: 6.4 g/dL (ref 6.0–8.5)
eGFR: 94 mL/min/{1.73_m2} (ref >59)

## 2021-11-12 LAB — CBC
Hematocrit: 40.8 % (ref 34.0–46.6)
Hemoglobin: 13.5 g/dL (ref 11.1–15.9)
MCH: 30.2 pg (ref 26.6–33.0)
MCHC: 33.1 g/dL (ref 31.5–35.7)
MCV: 91 fL (ref 79–97)
Platelets: 156 10*3/uL (ref 150–450)
RBC: 4.47 x10E6/uL (ref 3.77–5.28)
RDW: 13.1 % (ref 11.7–15.4)
WBC: 5.6 10*3/uL (ref 3.4–10.8)

## 2021-11-25 ENCOUNTER — Telehealth: Payer: Self-pay | Admitting: *Deleted

## 2021-11-25 ENCOUNTER — Ambulatory Visit (INDEPENDENT_AMBULATORY_CARE_PROVIDER_SITE_OTHER): Payer: Managed Care, Other (non HMO)

## 2021-11-25 DIAGNOSIS — I4819 Other persistent atrial fibrillation: Secondary | ICD-10-CM | POA: Diagnosis not present

## 2021-11-25 DIAGNOSIS — I5020 Unspecified systolic (congestive) heart failure: Secondary | ICD-10-CM

## 2021-11-25 DIAGNOSIS — I428 Other cardiomyopathies: Secondary | ICD-10-CM | POA: Diagnosis not present

## 2021-11-25 DIAGNOSIS — Z79899 Other long term (current) drug therapy: Secondary | ICD-10-CM

## 2021-11-25 LAB — ECHOCARDIOGRAM COMPLETE
AR max vel: 2.98 cm2
AV Area VTI: 3.11 cm2
AV Area mean vel: 2.89 cm2
AV Mean grad: 2 mmHg
AV Peak grad: 3 mmHg
Ao pk vel: 0.87 m/s
Calc EF: 43.6 %
S' Lateral: 4.48 cm
Single Plane A2C EF: 46.3 %
Single Plane A4C EF: 42.7 %

## 2021-11-25 MED ORDER — PRAVASTATIN SODIUM 40 MG PO TABS: 40.0000 mg | ORAL_TABLET | Freq: Every evening | ORAL | 3 refills | Status: DC

## 2021-11-25 MED ORDER — ENTRESTO 49-51 MG PO TABS: 1.0000 | ORAL_TABLET | Freq: Two times a day (BID) | ORAL | 6 refills | Status: DC

## 2021-11-25 NOTE — Telephone Encounter (Signed)
-----   Message from Satira Sark, MD sent at 11/12/2021  5:07 PM EDT ----- ?Results reviewed.  Renal function normal with creatinine 0.74, potassium normal at 4.3.  LFTs are also normal and her LDL is 121 off Pravachol.  Would suggest resuming Pravachol at 40 mg daily.  Also change from Cozaar to Entresto 41/51 mg twice daily.  Repeat BMET 2 weeks thereafter. ?

## 2021-11-25 NOTE — Telephone Encounter (Signed)
Patient informed and verbalized understanding of plan. ?Copy sent to PCP ?Lab order faxed to Lab [facility] Dr. Linna Hoff ?

## 2021-11-26 ENCOUNTER — Telehealth: Payer: Self-pay | Admitting: Cardiology

## 2021-11-26 NOTE — Telephone Encounter (Signed)
Says [facility] didn't receive entresto rx sent yesterday. Advised that rx has been refaxed. [facility] and verified [facility].  ?

## 2021-11-26 NOTE — Telephone Encounter (Signed)
Patient calling to speak with Dr. Myles Gip nurse, she needs to go over what medications he wanted her to stop and start. She states she forgot per their conversation from yesterday.  ?

## 2021-12-21 ENCOUNTER — Other Ambulatory Visit: Payer: Self-pay | Admitting: Cardiovascular Disease

## 2021-12-21 NOTE — Telephone Encounter (Signed)
Prescription refill request for Xarelto received.  ?Indication:afib ?Last office visit:[place] 11/09/21 ?Weight:103.7kg ?Age:55f?Scr:0.74 11/11/21 ?CrCl: 62 ? ?

## 2022-04-30 ENCOUNTER — Other Ambulatory Visit: Payer: Self-pay | Admitting: Cardiology

## 2022-05-10 NOTE — Progress Notes (Unsigned)
Cardiology Office Note  Date: 05/11/2022   ID: Jade Bradley, DOB 1960/12/24, MRN [id number]  PCP:  Sharilyn Sites, MD  Cardiologist:  Rozann Lesches, MD Electrophysiologist:  None   Chief Complaint  Patient presents with   Cardiac follow-up    History of Present Illness: Jade Bradley is a 61 y.o. female last seen in April.  She is here for a routine visit.  Reports NYHA class I-II dyspnea, no leg swelling or increasing weight.  Echocardiogram from April revealed LVEF 40 to 45% range.  Medications have been adjusted since our last visit.  She is now on Toprol-XL and Entresto.  Discussed need for follow-up BMET and further optimization of GDMT, most likely will add SGLT2 inhibitor next.  I went over home blood pressure checks which actually look fairly good, systolics ranging from 295 to a high of 145 although on average in the 120s.  I personally reviewed her ECG today which shows rate controlled atrial fibrillation.  Past Medical History:  Diagnosis Date   Anxiety    Atrial fibrillation (H[facility])    Essential hypertension    Mixed hyperlipidemia    Nonischemic cardiomyopathy ([facility])    OSA (obstructive sleep apnea)    Seasonal allergies     Past Surgical History:  Procedure Laterality Date   CHOLECYSTECTOMY     COLONOSCOPY N/A 04/07/2014   Procedure: COLONOSCOPY;  Surgeon: Danie Binder, MD;  [facility];  Service: Endoscopy;  Laterality: N/A;  8:30 AM   RIGHT/LEFT HEART CATH AND CORONARY ANGIOGRAPHY N/A 12/03/2019   Procedure: RIGHT/LEFT HEART CATH AND CORONARY ANGIOGRAPHY;  Surgeon: Burnell Blanks, MD;  Location: [place] [facility];  Service: Cardiovascular;  Laterality: N/A;    Current Outpatient Medications  Medication Sig Dispense Refill   ENTRESTO 49-51 MG TAKE 1 TABLET BY MOUTH TWICE DAILY 180 tablet 1   metoprolol succinate (TOPROL-XL) 100 MG 24 hr tablet Take 1 tablet (100 mg total) by mouth daily. Take with or immediately  following a meal. 90 tablet 3   pravastatin (PRAVACHOL) 40 MG tablet Take 1 tablet (40 mg total) by mouth every evening. 90 tablet 3   XARELTO 20 MG TABS tablet TAKE 1 TABLET BY MOUTH DAILY WITH SUPPER. 90 tablet 1   No current facility-administered medications for this visit.   Allergies:  Patient has no known allergies.   ROS: No orthopnea or PND.  No syncope.  Physical Exam: VS:  BP (!) 148/88   Pulse 68   Ht '5\' 8"'$  (1.727 m)   Wt 229 lb 6.4 oz (104.1 kg)   LMP 08/11/2011   SpO2 99%   BMI 34.88 kg/m , BMI Body mass index is 34.88 kg/m.  Wt Readings from Last 3 Encounters:  05/11/22 229 lb 6.4 oz (104.1 kg)  11/09/21 228 lb 9.6 oz (103.7 kg)  03/29/21 229 lb (103.9 kg)    General: Patient appears comfortable at rest. HEENT: Conjunctiva and lids normal. Neck: Supple, no elevated JVP or carotid bruits, no thyromegaly. Lungs: Clear to auscultation, nonlabored breathing at rest. Cardiac: Irregularly irregular, no S3 or significant systolic murmur, no pericardial rub. Extremities: No pitting edema.  ECG:  An ECG dated 03/29/2021 was personally reviewed today and demonstrated:  Atrial fibrillation with nonspecific ST changes.  Recent Labwork: 11/11/2021: ALT 12; AST 14; BUN 13; Creatinine, Ser 0.74; Hemoglobin 13.5; Platelets 156; Potassium 4.3; Sodium 142     Component Value Date/Time   CHOL 181 11/11/2021 0830  TRIG 91 11/11/2021 0830   HDL 43 11/11/2021 0830   CHOLHDL 4.2 11/11/2021 0830   CHOLHDL 3.9 12/01/2019 1459   VLDL 23 12/01/2019 1459   LDLCALC 121 (H) 11/11/2021 0830    Other Studies Reviewed Today:  Cardiac catheterization 12/03/2019: 1. No angiographic evidence of CAD 2. Normal right and left heart pressures.   Echocardiogram 11/25/2021:  1. Left ventricular ejection fraction, by estimation, is 40 to 45%. The  left ventricle has mildly decreased function. The left ventricle  demonstrates global hypokinesis. The left ventricular internal cavity size  was  mildly dilated. Left ventricular  diastolic parameters are indeterminate. The average left ventricular  global longitudinal strain is -10.4 %. The global longitudinal strain is  abnormal.   2. Right ventricular systolic function is normal. The right ventricular  size is normal. Tricuspid regurgitation signal is inadequate for assessing  PA pressure.   3. The mitral valve is grossly normal. Mild mitral valve regurgitation.   4. The aortic valve is tricuspid. Aortic valve regurgitation is not  visualized. Aortic valve mean gradient measures 2.0 mmHg.   5. The inferior vena cava is normal in size with greater than 50%  respiratory variability, suggesting right atrial pressure of 3 mmHg.   6. Evidence of atrial level shunting detected by color flow Doppler.  There is a small patent foramen ovale with predominantly left to right  shunting across the atrial septum.   Assessment and Plan:  1.  HFrEF with nonischemic cardiomyopathy, LVEF 40 to 45% and RV contraction normal.  She reports NYHA class I-II dyspnea.  Continue Toprol-XL and Entresto.  Check BMET with plan to add SGLT2 inhibitor.  2.  Persistent/permanent atrial fibrillation with CHA2DS2-VASc score of 3.  She is less symptomatic following up titration of Toprol-XL and her heart rate is well controlled today.  Continue Xarelto for stroke prophylaxis.  3.  OSA on CPAP.  4.  Mixed hyperlipidemia, continue Pravachol for now.  Medication Adjustments/Labs and Tests Ordered: Current medicines are reviewed at length with the patient today.  Concerns regarding medicines are outlined above.   Tests Ordered: Orders Placed This Encounter  Procedures   Basic metabolic panel   EKG 00-FVCB    Medication Changes: No orders of the defined types were placed in this encounter.   Disposition:  Follow up  6 months.  Signed, Satira Sark, MD, [facility] 05/11/2022 8:36 AM    [place] at [place],  [place], [place] [address] Phone: [phone]; Fax: [phone]

## 2022-05-11 ENCOUNTER — Encounter: Payer: Self-pay | Admitting: Cardiology

## 2022-05-11 ENCOUNTER — Ambulatory Visit: Payer: Managed Care, Other (non HMO) | Attending: Cardiology | Admitting: Cardiology

## 2022-05-11 VITALS — BP 148/88 | HR 68 | Ht 68.0 in | Wt 229.4 lb

## 2022-05-11 DIAGNOSIS — E782 Mixed hyperlipidemia: Secondary | ICD-10-CM

## 2022-05-11 DIAGNOSIS — I4819 Other persistent atrial fibrillation: Secondary | ICD-10-CM

## 2022-05-11 DIAGNOSIS — I5020 Unspecified systolic (congestive) heart failure: Secondary | ICD-10-CM

## 2022-05-11 DIAGNOSIS — Z79899 Other long term (current) drug therapy: Secondary | ICD-10-CM

## 2022-05-11 NOTE — Patient Instructions (Addendum)
Medication Instructions:  Your physician recommends that you continue on your current medications as directed. Please refer to the Current Medication list given to you today.  Labwork: BMET today at [employer]  Testing/Procedures: none  Follow-Up: Your physician recommends that you schedule a follow-up appointment in: 6 months  Any Other Special Instructions Will Be Listed Below (If Applicable).  If you need a refill on your cardiac medications before your next appointment, please call your pharmacy.

## 2022-05-12 ENCOUNTER — Telehealth: Payer: Self-pay | Admitting: *Deleted

## 2022-05-12 LAB — BASIC METABOLIC PANEL
BUN/Creatinine Ratio: 13 (ref 9–23)
BUN: 12 mg/dL (ref 6–24)
CO2: 20 mmol/L (ref 20–29)
Calcium: 9.2 mg/dL (ref 8.7–10.2)
Chloride: 103 mmol/L (ref 96–106)
Creatinine, Ser: 0.9 mg/dL (ref 0.57–1.00)
Glucose: 97 mg/dL (ref 70–99)
Potassium: 3.9 mmol/L (ref 3.5–5.2)
Sodium: 140 mmol/L (ref 134–144)
eGFR: 74 mL/min/{1.73_m2} (ref >59)

## 2022-05-12 MED ORDER — EMPAGLIFLOZIN 10 MG PO TABS: 10.0000 mg | ORAL_TABLET | Freq: Every day | ORAL | 6 refills | Status: DC

## 2022-05-12 NOTE — Telephone Encounter (Signed)
-----   Message from Satira Sark, MD sent at 05/12/2022  8:06 AM EDT ----- Results reviewed.  Renal function and potassium are normal.  Please check on her coverage for SGLT2 inhibitor, most likely with plan to start either Jardiance 10 mg daily or Farxiga 10 mg daily.

## 2022-05-12 NOTE — Telephone Encounter (Signed)
Patient informed and verbalized understanding of plan. Per pharmacist, Vania Rea is covered by insurance. Has 14 day free voucher and $10 co-pay card Copy sent to PCP

## 2022-07-13 ENCOUNTER — Other Ambulatory Visit: Payer: Self-pay | Admitting: Cardiology

## 2022-07-13 NOTE — Telephone Encounter (Signed)
Prescription refill request for Xarelto received.  Indication: AF Last office visit: 05/11/22  Myles Gip MD Weight: 104.1kg Age: 61 Scr: 0.90 on 05/11/22 CrCl: 110.61  Based on above findings Xarelto '20mg'$  daily is the appropriate dose.  Refill approved.

## 2022-08-17 ENCOUNTER — Other Ambulatory Visit: Payer: Self-pay | Admitting: Cardiology

## 2022-11-13 ENCOUNTER — Other Ambulatory Visit: Payer: Self-pay | Admitting: Cardiology

## 2022-11-26 ENCOUNTER — Other Ambulatory Visit: Payer: Self-pay | Admitting: Cardiology

## 2022-12-09 ENCOUNTER — Other Ambulatory Visit: Payer: Self-pay | Admitting: Cardiology

## 2022-12-16 ENCOUNTER — Ambulatory Visit: Payer: Managed Care, Other (non HMO) | Attending: Nurse Practitioner | Admitting: Nurse Practitioner

## 2022-12-16 ENCOUNTER — Encounter: Payer: Self-pay | Admitting: Nurse Practitioner

## 2022-12-16 VITALS — BP 124/70 | HR 88 | Ht 68.0 in | Wt 226.6 lb

## 2022-12-16 DIAGNOSIS — E785 Hyperlipidemia, unspecified: Secondary | ICD-10-CM

## 2022-12-16 DIAGNOSIS — I5022 Chronic systolic (congestive) heart failure: Secondary | ICD-10-CM | POA: Diagnosis not present

## 2022-12-16 DIAGNOSIS — Z79899 Other long term (current) drug therapy: Secondary | ICD-10-CM | POA: Diagnosis not present

## 2022-12-16 DIAGNOSIS — I4819 Other persistent atrial fibrillation: Secondary | ICD-10-CM

## 2022-12-16 DIAGNOSIS — I428 Other cardiomyopathies: Secondary | ICD-10-CM | POA: Diagnosis not present

## 2022-12-16 DIAGNOSIS — I10 Essential (primary) hypertension: Secondary | ICD-10-CM

## 2022-12-16 NOTE — Patient Instructions (Signed)
Medication Instructions:  Your physician recommends that you continue on your current medications as directed. Please refer to the Current Medication list given to you today.  *If you need a refill on your cardiac medications before your next appointment, please call your pharmacy*   Lab Work: Your physician recommends that you return for lab work in: Today   If you have labs (blood work) drawn today and your tests are completely normal, you will receive your results only by: MyChart Message (if you have MyChart) OR A paper copy in the mail If you have any lab test that is abnormal or we need to change your treatment, we will call you to review the results.   Testing/Procedures: NONE    Follow-Up: At [facility], you and your health needs are our priority.  As part of our continuing mission to provide you with exceptional heart care, we have created designated Provider Care Teams.  These Care Teams include your primary Cardiologist (physician) and Advanced Practice Providers (APPs -  Physician Assistants and Nurse Practitioners) who all work together to provide you with the care you need, when you need it.  We recommend signing up for the patient portal called "MyChart".  Sign up information is provided on this After Visit Summary.  MyChart is used to connect with patients for Virtual Visits (Telemedicine).  Patients are able to view lab/test results, encounter notes, upcoming appointments, etc.  Non-urgent messages can be sent to your provider as well.   To learn more about what you can do with MyChart, go to [email or web address].    Your next appointment:   3 month(s)  Provider:   Sharlene Dory, NP  Other Instructions Thank you for choosing [facility]!

## 2022-12-16 NOTE — Progress Notes (Unsigned)
Office Visit    Patient Name: Jade Bradley Date of Encounter: 12/16/2022  PCP:  Assunta Found, MD   [facility]  Cardiologist:  Nona Dell, MD *** Advanced Practice Provider:  No care team member to display Electrophysiologist:  None  {Press F2 to show EP APP, CHF, sleep or structural heart MD               :[id number]}  { Click here to update then REFRESH NOTE - MD (PCP) or APP (Team Member)  Change PCP Type for MD, Specialty for APP is either Cardiology or Clinical Cardiac Electrophysiology  :[id number]}  Chief Complaint    Jade Bradley is a 62 y.o. female with a hx of HFmrEF/NICM, persistent/permanent A-fib, hypertension, mixed hyperlipidemia, OSA on CPAP, and anxiety who presents today for scheduled follow-up.  Past Medical History    Past Medical History:  Diagnosis Date   Anxiety    Atrial fibrillation (HCC)    Essential hypertension    Mixed hyperlipidemia    Nonischemic cardiomyopathy (H[facility])    OSA (obstructive sleep apnea)    Seasonal allergies    Past Surgical History:  Procedure Laterality Date   CHOLECYSTECTOMY     COLONOSCOPY N/A 04/07/2014   Procedure: COLONOSCOPY;  Surgeon: West Bali, MD;  [facility];  Service: Endoscopy;  Laterality: N/A;  8:30 AM   RIGHT/LEFT HEART CATH AND CORONARY ANGIOGRAPHY N/A 12/03/2019   Procedure: RIGHT/LEFT HEART CATH AND CORONARY ANGIOGRAPHY;  Surgeon: Kathleene Hazel, MD;  [facility]: [facility];  Service: Cardiovascular;  Laterality: N/A;    Allergies  No Known Allergies  History of Present Illness    Jade Bradley is a 62 y.o. female with a PMH as mentioned above.  Echocardiogram from 11/2021 revealed EF 40 to 45%.  Last seen by Dr. Diona Browner on May 11, 2022.  Was overall doing very well.  Was started on SGLT2 inhibitor around that time.  Today she presents for scheduled follow-up.  She states  EKGs/Labs/Other Studies Reviewed:   The  following studies were reviewed today: ***  EKG:  EKG is *** ordered today.  The ekg ordered today demonstrates ***  Recent Labs: 05/11/2022: BUN 12; Creatinine, Ser 0.90; Potassium 3.9; Sodium 140  Recent Lipid Panel    Component Value Date/Time   CHOL 181 11/11/2021 0830   TRIG 91 11/11/2021 0830   HDL 43 11/11/2021 0830   CHOLHDL 4.2 11/11/2021 0830   CHOLHDL 3.9 12/01/2019 1459   VLDL 23 12/01/2019 1459   LDLCALC 121 (H) 11/11/2021 0830    Risk Assessment/Calculations:  {Does this patient have ATRIAL FIBRILLATION?[phone]}  Home Medications   No outpatient medications have been marked as taking for the 12/16/22 encounter (Appointment) with Sharlene Dory, NP.     Review of Systems   ***   All other systems reviewed and are otherwise negative except as noted above.  Physical Exam    VS:  LMP 08/11/2011  , BMI There is no height or weight on file to calculate BMI.  Wt Readings from Last 3 Encounters:  05/11/22 229 lb 6.4 oz (104.1 kg)  11/09/21 228 lb 9.6 oz (103.7 kg)  03/29/21 229 lb (103.9 kg)     GEN: Well nourished, well developed, in no acute distress. HEENT: normal. Neck: Supple, no JVD, carotid bruits, or masses. Cardiac: ***RRR, no murmurs, rubs, or gallops. No clubbing, cyanosis, edema.  ***Radials/PT 2+ and equal bilaterally.  Respiratory:  ***Respirations  regular and unlabored, clear to auscultation bilaterally. GI: Soft, nontender, nondistended. MS: No deformity or atrophy. Skin: Warm and dry, no rash. Neuro:  Strength and sensation are intact. Psych: Normal affect.  Assessment & Plan    ***  {Are you ordering a CV Procedure (e.g. stress test, cath, DCCV, TEE, etc)?   Press F2        :[id number]}      Disposition: Follow up {follow up:15908} with Nona Dell, MD or APP.  Signed, Sharlene Dory, NP 12/16/2022, 12:45 PM [facility]

## 2022-12-23 LAB — BASIC METABOLIC PANEL
BUN/Creatinine Ratio: 19 (ref 9–23)
BUN: 15 mg/dL (ref 6–24)
CO2: 22 mmol/L (ref 20–29)
Calcium: 9.2 mg/dL (ref 8.7–10.2)
Chloride: 106 mmol/L (ref 96–106)
Creatinine, Ser: 0.8 mg/dL (ref 0.57–1.00)
Glucose: 99 mg/dL (ref 70–99)
Potassium: 4.3 mmol/L (ref 3.5–5.2)
Sodium: 140 mmol/L (ref 134–144)
eGFR: 85 mL/min/{1.73_m2} (ref >59)

## 2022-12-27 ENCOUNTER — Telehealth: Payer: Self-pay | Admitting: *Deleted

## 2022-12-27 ENCOUNTER — Other Ambulatory Visit: Payer: Self-pay | Admitting: *Deleted

## 2022-12-27 DIAGNOSIS — I5022 Chronic systolic (congestive) heart failure: Secondary | ICD-10-CM

## 2022-12-27 DIAGNOSIS — Z79899 Other long term (current) drug therapy: Secondary | ICD-10-CM

## 2022-12-27 MED ORDER — ENTRESTO 97-103 MG PO TABS: 1.0000 | ORAL_TABLET | Freq: Two times a day (BID) | ORAL | 6 refills | Status: DC

## 2022-12-27 NOTE — Telephone Encounter (Signed)
-----   Message from Sharlene Dory, NP sent at 12/25/2022  7:06 PM EDT ----- Kidney function looks great. Please increase Entresto to 97/103 mg BID and we will repeat a BMET in 1 week for diagnosis of heart failure with mildly reduced ejection fraction.  Thanks!  Sharlene Dory, AGNP-C

## 2022-12-27 NOTE — Telephone Encounter (Signed)
Patient informed and verbalized understanding of plan. Copy sent to PCP Lab order placed for [facility] and released

## 2023-01-05 LAB — BASIC METABOLIC PANEL
BUN/Creatinine Ratio: 24 — ABNORMAL HIGH (ref 9–23)
BUN: 21 mg/dL (ref 6–24)
CO2: 21 mmol/L (ref 20–29)
Calcium: 8.9 mg/dL (ref 8.7–10.2)
Chloride: 106 mmol/L (ref 96–106)
Creatinine, Ser: 0.88 mg/dL (ref 0.57–1.00)
Glucose: 99 mg/dL (ref 70–99)
Potassium: 4.4 mmol/L (ref 3.5–5.2)
Sodium: 140 mmol/L (ref 134–144)
eGFR: 76 mL/min/{1.73_m2} (ref >59)

## 2023-01-06 ENCOUNTER — Other Ambulatory Visit: Payer: Self-pay | Admitting: Cardiology

## 2023-01-09 NOTE — Telephone Encounter (Signed)
Prescription refill request for Xarelto received.  Indication: AF Last office visit: 12/16/22  Shawnie Dapper NP Weight: 102.8kg Age: 62 Scr: 0.88 CrCl: 111.71  Based on above findings Xarelto 20mg  daily is the appropriate dose.  Refill approved.

## 2023-01-18 ENCOUNTER — Other Ambulatory Visit: Payer: Self-pay

## 2023-01-18 ENCOUNTER — Emergency Department (HOSPITAL_COMMUNITY)
Admission: EM | Admit: 2023-01-18 | Discharge: 2023-01-19 | Disposition: A | Discharge status: Home / Self Care | Payer: Managed Care, Other (non HMO) | Attending: Emergency Medicine | Admitting: Emergency Medicine

## 2023-01-18 ENCOUNTER — Emergency Department (HOSPITAL_COMMUNITY): Payer: Managed Care, Other (non HMO)

## 2023-01-18 DIAGNOSIS — R079 Chest pain, unspecified: Secondary | ICD-10-CM

## 2023-01-18 DIAGNOSIS — R110 Nausea: Secondary | ICD-10-CM | POA: Diagnosis not present

## 2023-01-18 DIAGNOSIS — R0602 Shortness of breath: Secondary | ICD-10-CM | POA: Insufficient documentation

## 2023-01-18 DIAGNOSIS — I10 Essential (primary) hypertension: Secondary | ICD-10-CM | POA: Diagnosis not present

## 2023-01-18 DIAGNOSIS — R0789 Other chest pain: Secondary | ICD-10-CM | POA: Insufficient documentation

## 2023-01-18 DIAGNOSIS — F17210 Nicotine dependence, cigarettes, uncomplicated: Secondary | ICD-10-CM | POA: Diagnosis not present

## 2023-01-18 LAB — CBC
HCT: 41.7 % (ref 36.0–46.0)
Hemoglobin: 13.5 g/dL (ref 12.0–15.0)
MCH: 29.3 pg (ref 26.0–34.0)
MCHC: 32.4 g/dL (ref 30.0–36.0)
MCV: 90.5 fL (ref 80.0–100.0)
Platelets: 146 10*3/uL — ABNORMAL LOW (ref 150–400)
RBC: 4.61 MIL/uL (ref 3.87–5.11)
RDW: 13.5 % (ref 11.5–15.5)
WBC: 7.7 10*3/uL (ref 4.0–10.5)
nRBC: 0 % (ref 0.0–0.2)

## 2023-01-18 NOTE — ED Triage Notes (Signed)
Pt BIB [facility] c/o chest pain that started around 1500 this afternoon. Hx of afib.   324 aspirin and 5 administrations of nitro spray given PTA

## 2023-01-19 ENCOUNTER — Emergency Department (HOSPITAL_COMMUNITY): Payer: Managed Care, Other (non HMO)

## 2023-01-19 LAB — BASIC METABOLIC PANEL
Anion gap: 8 (ref 5–15)
BUN: 16 mg/dL (ref 6–20)
CO2: 23 mmol/L (ref 22–32)
Calcium: 8.5 mg/dL — ABNORMAL LOW (ref 8.9–10.3)
Chloride: 108 mmol/L (ref 98–111)
Creatinine, Ser: 0.68 mg/dL (ref 0.44–1.00)
GFR, Estimated: >60 mL/min (ref >60)
Glucose, Bld: 124 mg/dL — ABNORMAL HIGH (ref 70–99)
Potassium: 3.2 mmol/L — ABNORMAL LOW (ref 3.5–5.1)
Sodium: 139 mmol/L (ref 135–145)

## 2023-01-19 LAB — TROPONIN I (HIGH SENSITIVITY)
Troponin I (High Sensitivity): 3 ng/L (ref <18)
Troponin I (High Sensitivity): 2 ng/L (ref <18)

## 2023-01-19 MED ORDER — FAMOTIDINE 20 MG PO TABS: 20.0000 mg | ORAL_TABLET | Freq: Two times a day (BID) | ORAL | 0 refills | Status: AC

## 2023-01-19 MED ORDER — ALUM & MAG HYDROXIDE-SIMETH 200-200-20 MG/5ML PO SUSP
30.0000 mL | Freq: Once | ORAL | Status: AC
  Administered 2023-01-19: 30 mL via ORAL
  Filled 2023-01-19: qty 30

## 2023-01-19 MED ORDER — ACETAMINOPHEN 500 MG PO TABS
1000.0000 mg | ORAL_TABLET | Freq: Once | ORAL | Status: AC
  Administered 2023-01-19: 1000 mg via ORAL
  Filled 2023-01-19: qty 2

## 2023-01-19 NOTE — ED Provider Notes (Signed)
[facility]-EMERGENCY DEPT [facility] Emergency Department Provider Note MRN:  [id number]  Arrival date & time: 01/19/23     Chief Complaint   Chest Pain   History of Present Illness   Jade Bradley is a 62 y.o. year-old female with a history of hypertension, A-fib presenting to the ED with chief complaint of chest pain.  Central chest discomfort starting at 3 PM, intermittently since then.  Random without exacerbating or alleviating factors.  Associated with shortness of breath, nausea.  Nonradiating.  No recent leg pain or swelling.  No recent fever or cough.  Review of Systems  A thorough review of systems was obtained and all systems are negative except as noted in the HPI and PMH.   Patient's Health History    Past Medical History:  Diagnosis Date   Anxiety    Atrial fibrillation (HCC)    Essential hypertension    Mixed hyperlipidemia    Nonischemic cardiomyopathy (HCC)    OSA (obstructive sleep apnea)    Seasonal allergies     Past Surgical History:  Procedure Laterality Date   CHOLECYSTECTOMY     COLONOSCOPY N/A 04/07/2014   Procedure: COLONOSCOPY;  Surgeon: West Bali, MD;  Location: [facility] ENDO SUITE;  Service: Endoscopy;  Laterality: N/A;  8:30 AM   RIGHT/LEFT HEART CATH AND CORONARY ANGIOGRAPHY N/A 12/03/2019   Procedure: RIGHT/LEFT HEART CATH AND CORONARY ANGIOGRAPHY;  Surgeon: Kathleene Hazel, MD;  Location: [facility] INVASIVE CV LAB;  Service: Cardiovascular;  Laterality: N/A;    Family History  Problem Relation Age of Onset   Heart attack Mother    CAD Mother    CAD Father    Heart attack Father    Hypertension Father    Diabetes Father    Colon cancer Neg Hx     Social History   Socioeconomic History   Marital status: Married    Spouse name: Not on file   Number of children: 3   Years of education: 10th grade   Highest education level: Not on file  Occupational History   Occupation: Film/video editor  Tobacco Use   Smoking status: Some  Days    Packs/day: 0.25    Years: 48.00    Additional pack years: 0.00    Total pack years: 12.00    Types: Cigarettes    Start date: 03/17/1975   Smokeless tobacco: Never   Tobacco comments:    1 pack per 2 weeks, really trying to cut back   Substance and Sexual Activity   Alcohol use: No   Drug use: Never   Sexual activity: Not on file  Other Topics Concern   Not on file  Social History Narrative   Lives at home with husband.   Right-handed.   Three cups caffeine per day.   Social Determinants of Health   Financial Resource Strain: Not on file  Food Insecurity: Not on file  Transportation Needs: Not on file  Physical Activity: Not on file  Stress: Not on file  Social Connections: Not on file  Intimate Partner Violence: Not on file     Physical Exam   Vitals:   01/19/23 0135 01/19/23 0200  BP: 110/67 108/67  Pulse: 83 76  Resp: 16 17  Temp:    SpO2: 96% 96%    CONSTITUTIONAL: Well-appearing, NAD NEURO/PSYCH:  Alert and oriented x 3, no focal deficits EYES:  eyes equal and reactive ENT/NECK:  no LAD, no JVD CARDIO: Regular rate, well-perfused, normal S1 and S2  PULM:  CTAB no wheezing or rhonchi GI/GU:  non-distended, non-tender MSK/SPINE:  No gross deformities, no edema SKIN:  no rash, atraumatic   *Additional and/or pertinent findings included in MDM below  Diagnostic and Interventional Summary    EKG Interpretation  Date/Time:  Wednesday January 18 2023 23:22:02 EDT Ventricular Rate:  88 PR Interval:    QRS Duration: 92 QT Interval:  368 QTC Calculation: 446 R Axis:   21 Text Interpretation: Atrial fibrillation Ventricular premature complex Confirmed by Kennis Carina [phone]) on 01/19/2023 12:30:16 AM       Labs Reviewed  BASIC METABOLIC PANEL - Abnormal; Notable for the following components:      Result Value   Potassium 3.2 (*)    Glucose, Bld 124 (*)    Calcium 8.5 (*)    All other components within normal limits  CBC - Abnormal; Notable for  the following components:   Platelets 146 (*)    All other components within normal limits  TROPONIN I (HIGH SENSITIVITY)  TROPONIN I (HIGH SENSITIVITY)    DG Chest 2 View  Final Result      Medications  acetaminophen (TYLENOL) tablet 1,000 mg (1,000 mg Oral Given 01/19/23 0052)  alum & mag hydroxide-simeth (MAALOX/MYLANTA) 200-200-20 MG/5ML suspension 30 mL (30 mLs Oral Given 01/19/23 0053)     Procedures  /  Critical Care Procedures  ED Course and Medical Decision Making  Initial Impression and Ddx Chest pain is nearly resolved at this time.  Has a headache after the nitroglycerin from EMS.  Sitting comfortably no acute distress with normal vital signs.  Will need 2 troponins.  Overall doubt PE based on history.  Patient is chronically anticoagulated given history of A-fib.  Highly doubt dissection.  Possibly a GI related cause given that it became worse when laying flat this evening.  Past medical/surgical history that increases complexity of ED encounter: A-fib  Interpretation of Diagnostics I personally reviewed the EKG and my interpretation is as follows: A-fib without concerning ischemic features  Labs reassuring with no significant blood count or electrolyte disturbance.  Troponin negative x 2.  Patient Reassessment and Ultimate Disposition/Management     Patient continues to look and feel well with no pain, vital signs normal.  Heart score of 3, appropriate for discharge.  Patient management required discussion with the following services or consulting groups:  None  Complexity of Problems Addressed Acute illness or injury that poses threat of life of bodily function  Additional Data Reviewed and Analyzed Further history obtained from: Further history from spouse/family member  Additional Factors Impacting ED Encounter Risk Consideration of hospitalization  Elmer Sow. Pilar Plate, MD [facility] Emergency Medicine [facility]  Health [email or web address]  Final Clinical Impressions(s) / ED Diagnoses     ICD-10-CM   1. Chest pain, unspecified type  R07.9       ED Discharge Orders          Ordered    famotidine (PEPCID) 20 MG tablet  2 times daily        01/19/23 0212             Discharge Instructions Discussed with and Provided to Patient:     Discharge Instructions      You were evaluated in the Emergency Department and after careful evaluation, we did not find any emergent condition requiring admission or further testing in the hospital.  Your exam/testing today is overall reassuring.  Symptoms may be due to acid reflux.  Try the  Pepcid twice daily for the next few days to see if it helps.  Recommend close follow-up with your primary care doctor and/or cardiology.  Please return to the Emergency Department if you experience any worsening of your condition.   Thank you for allowing [place] to be a part of your care.       Sabas Sous, MD 01/19/23 [phone]

## 2023-01-19 NOTE — Discharge Instructions (Addendum)
You were evaluated in the Emergency Department and after careful evaluation, we did not find any emergent condition requiring admission or further testing in the hospital.  Your exam/testing today is overall reassuring.  Symptoms may be due to acid reflux.  Try the Pepcid twice daily for the next few days to see if it helps.  Recommend close follow-up with your primary care doctor and/or cardiology.  Please return to the Emergency Department if you experience any worsening of your condition.   Thank you for allowing [place] to be a part of your care.

## 2023-03-17 ENCOUNTER — Other Ambulatory Visit: Payer: Self-pay | Admitting: Cardiology

## 2023-03-21 ENCOUNTER — Ambulatory Visit: Payer: Managed Care, Other (non HMO) | Attending: Nurse Practitioner | Admitting: Nurse Practitioner

## 2023-03-21 ENCOUNTER — Encounter: Payer: Self-pay | Admitting: Nurse Practitioner

## 2023-03-21 VITALS — BP 144/86 | HR 74 | Ht 68.0 in | Wt 234.8 lb

## 2023-03-21 DIAGNOSIS — Z79899 Other long term (current) drug therapy: Secondary | ICD-10-CM

## 2023-03-21 DIAGNOSIS — I4819 Other persistent atrial fibrillation: Secondary | ICD-10-CM

## 2023-03-21 DIAGNOSIS — I5022 Chronic systolic (congestive) heart failure: Secondary | ICD-10-CM

## 2023-03-21 DIAGNOSIS — I428 Other cardiomyopathies: Secondary | ICD-10-CM | POA: Diagnosis not present

## 2023-03-21 DIAGNOSIS — I10 Essential (primary) hypertension: Secondary | ICD-10-CM

## 2023-03-21 DIAGNOSIS — E785 Hyperlipidemia, unspecified: Secondary | ICD-10-CM

## 2023-03-21 MED ORDER — SPIRONOLACTONE 25 MG PO TABS: 12.5000 mg | ORAL_TABLET | Freq: Every day | ORAL | 6 refills | Status: DC

## 2023-03-21 NOTE — Progress Notes (Signed)
Office Visit    Patient Name: Jade Bradley Date of Encounter: 03/21/2023 PCP:  Assunta Found, MD [facility]  Cardiologist:  Nona Dell, MD  Advanced Practice Provider:  No care team member to display Electrophysiologist:  None   Chief Complaint    Jade Bradley is a 62 y.o. female with a hx of HFmrEF/NICM, persistent/permanent A-fib, hypertension, mixed hyperlipidemia, OSA on CPAP, and anxiety who presents today for scheduled follow-up.  Echocardiogram from 11/2021 revealed EF 40 to 45%.  Last seen by Dr. Diona Browner on May 11, 2022.  Was overall doing very well.  Was started on SGLT2 inhibitor around that time.  Today she presents for scheduled follow-up.  She states she is doing well. Denies any chest pain, shortness of breath, palpitations, syncope, presyncope, dizziness, orthopnea, PND, swelling or significant weight changes, acute bleeding, or claudication. Tolerating her medications well. Compliant with her medications.   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  EKGs/Labs/Other Studies Reviewed:   The following studies were reviewed today:   EKG:  EKG is not ordered today.   Echo 11/2021: 1. Left ventricular ejection fraction, by estimation, is 40 to 45%. The  left ventricle has mildly decreased function. The left ventricle  demonstrates global hypokinesis. The left ventricular internal cavity size  was mildly dilated. Left ventricular  diastolic parameters are indeterminate. The average left ventricular  global longitudinal strain is -10.4 %. The global longitudinal strain is  abnormal.   2. Right ventricular systolic function is normal. The right ventricular  size is normal. Tricuspid regurgitation signal is inadequate for assessing  PA pressure.   3. The mitral valve is grossly normal. Mild mitral valve regurgitation.   4. The aortic valve is  tricuspid. Aortic valve regurgitation is not  visualized. Aortic valve mean gradient measures 2.0 mmHg.   5. The inferior vena cava is normal in size with greater than 50%  respiratory variability, suggesting right atrial pressure of 3 mmHg.   6. Evidence of atrial level shunting detected by color flow Doppler.  There is a small patent foramen ovale with predominantly left to right  shunting across the atrial septum.   Comparison(s): Prior images reviewed side by side. LVEF is stable.  Right and LHC 11/2019: 1. No angiographic evidence of CAD 2. Normal right and left heart pressures.    Recommendations: No further ischemic workup. Resume Xarelto tomorrow. OK to discharge home today.   Risk Assessment/Calculations:   CHA2DS2-VASc Score = 3  This indicates a 3.2% annual risk of stroke. The patient's score is based upon: CHF History: 1 HTN History: 1 Diabetes History: 0 Stroke History: 0 Vascular Disease History: 0 Age Score: 0 Gender Score: 1  Review of Systems    All other systems reviewed and are otherwise negative except as noted above.  Physical Exam    VS:  BP (!) 144/86   Pulse 74   Ht 5\' 8"  (1.727 m)   Wt 234 lb 12.8 oz (106.5 kg)   LMP 08/11/2011   SpO2 98%   BMI 35.70 kg/m  , BMI Body mass index is 35.7 kg/m.  Wt Readings from Last 3 Encounters:  03/21/23 234 lb 12.8 oz (106.5 kg)  01/18/23 230 lb (104.3 kg)  12/16/22 226 lb 9.6 oz (102.8 kg)    GEN: Obese, 62 y.o. female in no acute distress. HEENT: normal. Neck: Supple, no JVD, carotid bruits,  or masses. Cardiac: S1/S2, irregular rhythm and regular rate, no murmurs, rubs, or gallops. No clubbing, cyanosis, edema.  Radials/PT 2+ and equal bilaterally.  Respiratory:  Respirations regular and unlabored, clear to auscultation bilaterally. MS: No deformity or atrophy. Skin: Warm and dry, no rash. Neuro:  Strength and sensation are intact. Psych: Normal affect.  Assessment & Plan    HFmrEF, NICM,  medication management Stage C, class I. Echo 11/2021 revealed EF 40-45%. Euvolemic and well compensated on exam. Continue Metoprolol, Entresto, and Jardiance. Will start Spironolactone 12.5 mg daily and obtain CMET with [facility] in [place]. Low sodium diet, fluid restriction <2L, and daily weights encouraged. Educated to contact our office for weight gain of 2 lbs overnight or 5 lbs in one week.  Persistent/Permanent A-fib Denies any tachycardia or palpitations.  Heart rate well-controlled.  Continue Xarelto, denies any bleeding issues. Obtaining CMET as mentioned above. Heart healthy diet and regular cardiovascular exercise encouraged.   HTN Blood pressure stable.  Continue current medication regimen, will start Aldactone 12.5 mg daily as mentioned above and obtain CMET. Discussed to monitor BP at home at least 2 hours after medications and sitting for 5-10 minutes. Heart healthy diet and regular cardiovascular exercise encouraged.   HLD LDL 1 year ago, 121, was told to resume Pravastatin at 40 mg daily as she was off this medication. Will plan to obtain FLP/request labs from PCP at next OV. Continue Pravastatin. Heart healthy diet and regular cardiovascular exercise encouraged.   Disposition: Follow up in 3 months with Nona Dell, MD or APP.  Signed, Sharlene Dory, NP 03/21/2023, 12:07 PM [facility]

## 2023-03-21 NOTE — Patient Instructions (Signed)
Medication Instructions:   Begin Spironolactone 12.5mg  daily  Continue all other medications.     Labwork:  CMET  - order given Please do in 7-10 days  Office will contact with results via phone, letter or mychart.     Testing/Procedures:  none  Follow-Up:  3 months   Any Other Special Instructions Will Be Listed Below (If Applicable).   If you need a refill on your cardiac medications before your next appointment, please call your pharmacy.

## 2023-03-28 ENCOUNTER — Other Ambulatory Visit (HOSPITAL_BASED_OUTPATIENT_CLINIC_OR_DEPARTMENT_OTHER): Payer: Self-pay | Admitting: Nurse Practitioner

## 2023-03-29 LAB — COMPREHENSIVE METABOLIC PANEL
ALT: 11 IU/L (ref 0–32)
AST: 15 IU/L (ref 0–40)
Albumin: 4.2 g/dL (ref 3.8–4.9)
Alkaline Phosphatase: 92 IU/L (ref 44–121)
BUN/Creatinine Ratio: 15 (ref 12–28)
BUN: 14 mg/dL (ref 8–27)
Bilirubin Total: 0.5 mg/dL (ref 0.0–1.2)
CO2: 22 mmol/L (ref 20–29)
Calcium: 9.2 mg/dL (ref 8.7–10.3)
Chloride: 105 mmol/L (ref 96–106)
Creatinine, Ser: 0.95 mg/dL (ref 0.57–1.00)
Globulin, Total: 2.3 g/dL (ref 1.5–4.5)
Glucose: 85 mg/dL (ref 70–99)
Potassium: 4.6 mmol/L (ref 3.5–5.2)
Sodium: 139 mmol/L (ref 134–144)
Total Protein: 6.5 g/dL (ref 6.0–8.5)
eGFR: 69 mL/min/{1.73_m2} (ref >59)

## 2023-05-13 ENCOUNTER — Other Ambulatory Visit: Payer: Self-pay | Admitting: Cardiology

## 2023-06-22 ENCOUNTER — Ambulatory Visit: Payer: Managed Care, Other (non HMO) | Attending: Nurse Practitioner | Admitting: Nurse Practitioner

## 2023-06-22 ENCOUNTER — Encounter: Payer: Self-pay | Admitting: Nurse Practitioner

## 2023-06-22 VITALS — BP 118/72 | HR 79 | Ht 68.0 in | Wt 229.8 lb

## 2023-06-22 DIAGNOSIS — I10 Essential (primary) hypertension: Secondary | ICD-10-CM | POA: Diagnosis not present

## 2023-06-22 DIAGNOSIS — E785 Hyperlipidemia, unspecified: Secondary | ICD-10-CM

## 2023-06-22 DIAGNOSIS — I428 Other cardiomyopathies: Secondary | ICD-10-CM | POA: Diagnosis not present

## 2023-06-22 DIAGNOSIS — I5022 Chronic systolic (congestive) heart failure: Secondary | ICD-10-CM

## 2023-06-22 DIAGNOSIS — I4819 Other persistent atrial fibrillation: Secondary | ICD-10-CM

## 2023-06-22 MED ORDER — METOPROLOL SUCCINATE ER 50 MG PO TB24: 50.0000 mg | ORAL_TABLET | Freq: Every day | ORAL | 1 refills | Status: DC

## 2023-06-22 NOTE — Progress Notes (Signed)
Office Visit    Patient Name: Jade Bradley Date of Encounter: 06/22/2023 PCP:  Assunta Found, MD [facility]  Cardiologist:  Nona Dell, MD  Advanced Practice Provider:  No care team member to display Electrophysiologist:  None   Chief Complaint    Jade Bradley is a 62 y.o. female with a hx of HFmrEF/NICM, persistent/permanent A-fib, hypertension, mixed hyperlipidemia, OSA on CPAP, and anxiety who presents today for scheduled follow-up.  Echocardiogram from 11/2021 revealed EF 40 to 45%.  Last seen by Dr. Diona Browner on May 11, 2022.  Was overall doing very well.  Was started on SGLT2 inhibitor around that time.  Today she presents for scheduled follow-up. Admits to some rare episodes of dizzy spells that she believes is related to her low BP, says she recently checked her BP during one of the spells, BP 98/60.  Denies any chest pain, shortness of breath, palpitations, syncope, presyncope, dizziness, orthopnea, PND, swelling or significant weight changes, acute bleeding, or claudication. Compliant with her medications.   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  EKGs/Labs/Other Studies Reviewed:   The following studies were reviewed today:   EKG:  EKG is not ordered today.   Echo 11/2021: 1. Left ventricular ejection fraction, by estimation, is 40 to 45%. The  left ventricle has mildly decreased function. The left ventricle  demonstrates global hypokinesis. The left ventricular internal cavity size  was mildly dilated. Left ventricular  diastolic parameters are indeterminate. The average left ventricular  global longitudinal strain is -10.4 %. The global longitudinal strain is  abnormal.   2. Right ventricular systolic function is normal. The right ventricular  size is normal. Tricuspid regurgitation signal is inadequate for assessing  PA pressure.   3. The  mitral valve is grossly normal. Mild mitral valve regurgitation.   4. The aortic valve is tricuspid. Aortic valve regurgitation is not  visualized. Aortic valve mean gradient measures 2.0 mmHg.   5. The inferior vena cava is normal in size with greater than 50%  respiratory variability, suggesting right atrial pressure of 3 mmHg.   6. Evidence of atrial level shunting detected by color flow Doppler.  There is a small patent foramen ovale with predominantly left to right  shunting across the atrial septum.   Comparison(s): Prior images reviewed side by side. LVEF is stable.  Right and LHC 11/2019: 1. No angiographic evidence of CAD 2. Normal right and left heart pressures.    Recommendations: No further ischemic workup. Resume Xarelto tomorrow. OK to discharge home today.   Risk Assessment/Calculations:   CHA2DS2-VASc Score = 3  This indicates a 3.2% annual risk of stroke. The patient's score is based upon: CHF History: 1 HTN History: 1 Diabetes History: 0 Stroke History: 0 Vascular Disease History: 0 Age Score: 0 Gender Score: 1  Review of Systems    All other systems reviewed and are otherwise negative except as noted above.  Physical Exam    VS:  BP 118/72   Pulse 79   Ht 5\' 8"  (1.727 m)   Wt 229 lb 12.8 oz (104.2 kg)   LMP 08/11/2011   SpO2 99%   BMI 34.94 kg/m  , BMI Body mass index is 34.94 kg/m.  Wt Readings from Last 3 Encounters:  06/22/23 229 lb 12.8 oz (104.2 kg)  03/21/23 234 lb 12.8 oz (106.5 kg)  01/18/23 230 lb (104.3 kg)  GEN: Obese, 62 y.o. female in no acute distress. HEENT: normal. Neck: Supple, no JVD, carotid bruits, or masses. Cardiac: S1/S2, irregular rhythm and regular rate, no murmurs, rubs, or gallops. No clubbing, cyanosis, edema.  Radials/PT 2+ and equal bilaterally.  Respiratory:  Respirations regular and unlabored, clear to auscultation bilaterally. MS: No deformity or atrophy. Skin: Warm and dry, no rash. Neuro:  Strength and  sensation are intact. Psych: Normal affect.  Assessment & Plan    HFmrEF, NICM Stage C, class I. Echo 11/2021 revealed EF 40-45%. Euvolemic and well compensated on exam. Continue Entresto, Jardiance, and Aldactone.  Will reduce metoprolol succinate to 50 mg to improve her BP.  Low sodium diet, fluid restriction <2L, and daily weights encouraged. Educated to contact our office for weight gain of 2 lbs overnight or 5 lbs in one week.  Persistent/Permanent A-fib Denies any tachycardia or palpitations.  Heart rate well-controlled.  Reducing metoprolol succinate to 50 mg daily as mentioned above.  Continue Xarelto, denies any bleeding issues.  She is on appropriate dosage.  Heart healthy diet and regular cardiovascular exercise encouraged.   HTN Blood pressure occasionally low and admits to rare dizzy spells with this, sounds medication related.  Reducing metoprolol succinate to 50 mg daily as mentioned above.  Continue rest of medication regimen.  Discussed to monitor BP at home at least 2 hours after medications and sitting for 5-10 minutes. Heart healthy diet and regular cardiovascular exercise encouraged.   HLD LDL 1 year ago, 121, was told to resume Pravastatin at 40 mg daily as she was off this medication. Will obtain FLP. Continue Pravastatin. Heart healthy diet and regular cardiovascular exercise encouraged.   Disposition: Follow up in 6 months with Nona Dell, MD or APP.  Signed, Sharlene Dory, NP

## 2023-06-22 NOTE — Patient Instructions (Addendum)
Medication Instructions:  Your physician has recommended you make the following change in your medication:  Please reduce metoprolol succinate (TOPROL-XL) to 50 MG daily  Labwork: In 1-2 weeks   Testing/Procedures: None   Follow-Up: Your physician recommends that you schedule a follow-up appointment in: 6 Months   Any Other Special Instructions Will Be Listed Below (If Applicable).  If you need a refill on your cardiac medications before your next appointment, please call your pharmacy.

## 2023-06-28 LAB — LIPID PANEL
Chol/HDL Ratio: 3.5 ratio (ref 0.0–4.4)
Cholesterol, Total: 139 mg/dL (ref 100–199)
HDL: 40 mg/dL (ref >39)
LDL Chol Calc (NIH): 82 mg/dL (ref 0–99)
Triglycerides: 89 mg/dL (ref 0–149)
VLDL Cholesterol Cal: 17 mg/dL (ref 5–40)

## 2023-07-09 ENCOUNTER — Other Ambulatory Visit: Payer: Self-pay | Admitting: Nurse Practitioner

## 2023-09-08 ENCOUNTER — Other Ambulatory Visit: Payer: Self-pay | Admitting: Nurse Practitioner

## 2023-09-10 ENCOUNTER — Other Ambulatory Visit: Payer: Self-pay

## 2023-09-10 ENCOUNTER — Emergency Department (HOSPITAL_COMMUNITY)
Admission: EM | Admit: 2023-09-10 | Discharge: 2023-09-10 | Disposition: A | Discharge status: Home / Self Care | Payer: Worker's Compensation

## 2023-09-10 ENCOUNTER — Emergency Department (HOSPITAL_COMMUNITY): Payer: Managed Care, Other (non HMO)

## 2023-09-10 ENCOUNTER — Encounter (HOSPITAL_COMMUNITY): Payer: Self-pay | Admitting: Emergency Medicine

## 2023-09-10 DIAGNOSIS — I509 Heart failure, unspecified: Secondary | ICD-10-CM | POA: Insufficient documentation

## 2023-09-10 DIAGNOSIS — M25511 Pain in right shoulder: Secondary | ICD-10-CM | POA: Diagnosis present

## 2023-09-10 DIAGNOSIS — S40011A Contusion of right shoulder, initial encounter: Secondary | ICD-10-CM | POA: Diagnosis not present

## 2023-09-10 DIAGNOSIS — I4891 Unspecified atrial fibrillation: Secondary | ICD-10-CM | POA: Diagnosis not present

## 2023-09-10 DIAGNOSIS — Y990 Civilian activity done for income or pay: Secondary | ICD-10-CM | POA: Diagnosis not present

## 2023-09-10 DIAGNOSIS — Z7901 Long term (current) use of anticoagulants: Secondary | ICD-10-CM | POA: Insufficient documentation

## 2023-09-10 DIAGNOSIS — W010XXA Fall on same level from slipping, tripping and stumbling without subsequent striking against object, initial encounter: Secondary | ICD-10-CM | POA: Insufficient documentation

## 2023-09-10 MED ORDER — DICLOFENAC SODIUM 1 % EX GEL: 2.0000 g | Freq: Four times a day (QID) | CUTANEOUS | 0 refills | Status: DC

## 2023-09-10 MED ORDER — OXYCODONE-ACETAMINOPHEN 5-325 MG PO TABS
1.0000 | ORAL_TABLET | Freq: Once | ORAL | Status: AC
  Administered 2023-09-10: 1 via ORAL
  Filled 2023-09-10: qty 1

## 2023-09-10 NOTE — ED Triage Notes (Signed)
Pt had trip and fall at work this am. Denies LOC or hitting head. Pt landed on right arm and pain from shoulder to hand. Pt is on xarelto.

## 2023-09-10 NOTE — Discharge Instructions (Addendum)
Your x-rays did not show any issues with your shoulder.  This may be due to a sprain.  Please take Tylenol at home as needed for pain.  You may also try the Voltaren gel that was prescribed.  Please follow-up with your doctor.  If you are having persistent symptoms schedule follow-up appointment with the orthopedic surgeon at the number provided.  Return to the ER or call your doctor for worsening symptoms.

## 2023-09-10 NOTE — ED Provider Notes (Signed)
[place] [facility]GENC[facility] DEPARTMENT AT [facility] Provider Note   CSN: [id number] Arrival date & time: 09/10/23  4098     History  Chief Complaint  Patient presents with   Marletta Lor    Jade Bradley is a 63 y.o. female.  64 year old female with past medical history of CHF and atrial fibrillation on Xarelto presenting to the emergency department today with right shoulder pain.  Patient is complaining of pain in her right shoulder and trapezius.  She states that this occurred after she tripped at work.  She states that she moved a mop at work when she went into the work room and this apparently fell down.  She did not realize this and when she walked out she tripped on this and fell on her right side.  She did fall on her right shoulder.  She reports pain in the right shoulder.  Denies any other symptoms at this time.  She is on Xarelto.  She denies any associated headache.  Did not hit her head or lose consciousness.   Fall       Home Medications Prior to Admission medications   Medication Sig Start Date End Date Taking? Authorizing Provider  diclofenac Sodium (VOLTAREN) 1 % GEL Apply 2 g topically 4 (four) times daily. 09/10/23  Yes Durwin Glaze, MD  empagliflozin (JARDIANCE) 10 MG TABS tablet TAKE 1 TABLET BY MOUTH DAILY BEFORE BREAKFAST. 05/15/23   Jonelle Sidle, MD  ENTRESTO 97-103 MG TAKE 1 TABLET BY MOUTH TWICE A DAY 07/10/23   Sharlene Dory, NP  famotidine (PEPCID) 20 MG tablet Take 1 tablet (20 mg total) by mouth 2 (two) times daily. 01/19/23   Sabas Sous, MD  metoprolol succinate (TOPROL-XL) 50 MG 24 hr tablet Take 1 tablet (50 mg total) by mouth daily. Take with or immediately following a meal. 06/22/23   Sharlene Dory, NP  pravastatin (PRAVACHOL) 40 MG tablet TAKE 1 TABLET BY MOUTH EVERY DAY IN THE EVENING 03/17/23   Jonelle Sidle, MD  spironolactone (ALDACTONE) 25 MG tablet TAKE 1/2 TABLET BY MOUTH EVERY DAY 09/08/23   Sharlene Dory, NP  XARELTO 20  MG TABS tablet TAKE 1 TABLET BY MOUTH DAILY WITH SUPPER 01/09/23   Jonelle Sidle, MD      Allergies    Patient has no known allergies.    Review of Systems   Review of Systems  Musculoskeletal:        Right shoulder pain  All other systems reviewed and are negative.   Physical Exam Updated Vital Signs BP 132/86 (BP Location: Left Arm)   Pulse 67   Temp 98 F (36.7 C) (Oral)   Resp 16   Ht 5\' 8"  (1.727 m)   Wt 104 kg   LMP 08/11/2011   SpO2 94%   BMI 34.86 kg/m  Physical Exam Vitals and nursing note reviewed.   Gen: NAD Eyes: PERRL, EOMI HEENT: no oropharyngeal swelling Neck: No midline tenderness but the patient does have some tenderness over the paraspinal region on the right and over the right trapezius Resp: clear to auscultation bilaterally Card: RRR, no murmurs, rubs, or gallops Abd: nontender, nondistended Extremities: The patient is tender over the right clavicle and right proximal humerus with no gross deformity noted, she has limited range of motion due to pain.  There is no significant tenderness over the elbow, wrist, or hand on the right.  The remainder of the upper and lower extremities are atraumatic. Vascular: 2+  radial pulses bilaterally, 2+ DP pulses bilaterally Neuro: Equal strength and sensation throughout bilateral upper and lower extremities, cranial nerves intact Skin: no rashes Psyc: acting appropriately   ED Results / Procedures / Treatments   Labs (all labs ordered are listed, but only abnormal results are displayed) Labs Reviewed - No data to display  EKG None  Radiology CT Head Wo Contrast Result Date: 09/10/2023 CLINICAL DATA:  Neck trauma with dangerous injury mechanism. Head trauma. EXAM: CT HEAD WITHOUT CONTRAST CT CERVICAL SPINE WITHOUT CONTRAST TECHNIQUE: Multidetector CT imaging of the head and cervical spine was performed following the standard protocol without intravenous contrast. Multiplanar CT image reconstructions of the  cervical spine were also generated. RADIATION DOSE REDUCTION: This exam was performed according to the departmental dose-optimization program which includes automated exposure control, adjustment of the mA and/or kV according to patient size and/or use of iterative reconstruction technique. COMPARISON:  None Available. FINDINGS: CT HEAD FINDINGS Brain: No evidence of swelling, infarction, hemorrhage, hydrocephalus, extra-axial collection or mass lesion/mass effect. Vascular: No hyperdense vessel or unexpected calcification. Skull: Normal. Negative for fracture or focal lesion. Sinuses/Orbits: No acute finding. CT CERVICAL SPINE FINDINGS Alignment: Normal. Skull base and vertebrae: No acute fracture. No primary bone lesion or focal pathologic process. Soft tissues and spinal canal: No prevertebral fluid or swelling. No visible canal hematoma. Disc levels:  No visible spinal canal impingement Upper chest: No visible injury IMPRESSION: No evidence of intracranial or cervical spine injury. Electronically Signed   By: Tiburcio Pea M.D.   On: 09/10/2023 08:52   CT Cervical Spine Wo Contrast Result Date: 09/10/2023 CLINICAL DATA:  Neck trauma with dangerous injury mechanism. Head trauma. EXAM: CT HEAD WITHOUT CONTRAST CT CERVICAL SPINE WITHOUT CONTRAST TECHNIQUE: Multidetector CT imaging of the head and cervical spine was performed following the standard protocol without intravenous contrast. Multiplanar CT image reconstructions of the cervical spine were also generated. RADIATION DOSE REDUCTION: This exam was performed according to the departmental dose-optimization program which includes automated exposure control, adjustment of the mA and/or kV according to patient size and/or use of iterative reconstruction technique. COMPARISON:  None Available. FINDINGS: CT HEAD FINDINGS Brain: No evidence of swelling, infarction, hemorrhage, hydrocephalus, extra-axial collection or mass lesion/mass effect. Vascular: No  hyperdense vessel or unexpected calcification. Skull: Normal. Negative for fracture or focal lesion. Sinuses/Orbits: No acute finding. CT CERVICAL SPINE FINDINGS Alignment: Normal. Skull base and vertebrae: No acute fracture. No primary bone lesion or focal pathologic process. Soft tissues and spinal canal: No prevertebral fluid or swelling. No visible canal hematoma. Disc levels:  No visible spinal canal impingement Upper chest: No visible injury IMPRESSION: No evidence of intracranial or cervical spine injury. Electronically Signed   By: Tiburcio Pea M.D.   On: 09/10/2023 08:52   DG Shoulder Right Result Date: 09/10/2023 CLINICAL DATA:  63 year old female with history of trauma from a fall complaining of right shoulder pain. EXAM: RIGHT SHOULDER - 2+ VIEW COMPARISON:  No priors. FINDINGS: Three views of the right shoulder demonstrate no acute displaced fracture, subluxation or dislocation. 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 IMPRESSION: 1. No acute radiographic abnormality of the right shoulder. Electronically Signed   By: Trudie Reed M.D.   On: 09/10/2023 08:36    Procedures Procedures    Medications Ordered in ED Medications  oxyCODONE-acetaminophen (PERCOCET/ROXICET) 5-325 MG per tablet 1 tablet (1 tablet Oral Given 09/10/23 0818)    ED Course/ Medical Decision Making/ A&P  Medical Decision Making 63 year old female with past medical history of CHF and atrial fibrillation on Xarelto presenting to the emergency department today with right shoulder pain after a mechanical fall.  I will further evaluate the patient here with an x-ray to evaluate for acute fracture or dislocation.  With her being on Xarelto will obtain a CT scan of her head to eval for intracranial hemorrhage.  Will also obtain a CT scan of her cervical spine as she does have some paraspinal tenderness and this could be due to  radicular symptoms.  I give the patient Percocet for symptoms and reevaluate for ultimate disposition.  The patient's x-rays and CT scans do not show any acute traumatic injuries.  She is placed in a shoulder sling is discharged with outpatient follow-up with return precautions.  Amount and/or Complexity of Data Reviewed Radiology: ordered.  Risk Prescription drug management.           Final Clinical Impression(s) / ED Diagnoses Final diagnoses:  Contusion of multiple sites of right shoulder, initial encounter    Rx / DC Orders ED Discharge Orders          Ordered    diclofenac Sodium (VOLTAREN) 1 % GEL  4 times daily        09/10/23 0912              Durwin Glaze, MD 09/10/23 [phone]

## 2023-10-06 ENCOUNTER — Telehealth: Payer: Self-pay | Admitting: *Deleted

## 2023-10-06 ENCOUNTER — Telehealth: Payer: Self-pay

## 2023-10-06 NOTE — Telephone Encounter (Signed)
 Patient with diagnosis of A Fib on Xarelto for anticoagulation.    Procedure: shoulder sx Date of procedure: TBD   CHA2DS2-VASc Score = 3  his indicates a 3.2% annual risk of stroke. The patient's score is based upon: CHF History: 1 HTN History: 1 Diabetes History: 0 Stroke History: 0 Vascular Disease History: 0 Age Score: 0 Gender Score: 1    CrCl 103 ml/min Platelet count 146K   Per office protocol, patient can hold Xarelto for 3 days prior to procedure.      **This guidance is not considered finalized until pre-operative APP has relayed final recommendations.**

## 2023-10-06 NOTE — Telephone Encounter (Signed)
   Pre-operative Risk Assessment    Patient Name: Jade Bradley  DOB: 09/03/1960 MRN: [id number]   Date of last office visit: 06/22/2023 Date of next office visit: RECALL FOR 12/19/2023   Request for Surgical Clearance    Procedure:   SHOULDER   Date of Surgery:  Clearance TBD                                Surgeon:  DR. Jene Every Surgeon's Group or Practice Name:  [facility] Phone number:  [phone] Fax number:  [phone]   Type of Clearance Requested:   - Medical    Type of Anesthesia:  General    Additional requests/questions:   PLEASE FAX BACK TO [facility]  AT [phone]  Elvin So   10/06/2023, 11:59 AM

## 2023-10-06 NOTE — Telephone Encounter (Signed)
   Name: Jade Bradley  DOB: 04/03/1961  MRN: [id number]  Primary Cardiologist: Nona Dell, MD   Preoperative team, please contact this patient and set up a phone call appointment for further preoperative risk assessment. Please obtain consent and complete medication review. Thank you for your help.  I confirm that guidance regarding antiplatelet and oral anticoagulation therapy has been completed and, if necessary, noted below.  Per office protocol, patient can hold Xarelto for 3 days prior to procedure. Please resume Xarelto as soon as possible postprocedure, at the discretion of the surgeon.   Pt should also hold Jardiance for 72 hours prior to anesthesia, resume as soon as possible postprocedure.   I also confirmed the patient resides in the state of [place]. As per [facility] Medical Board telemedicine laws, the patient must reside in the state in which the provider is licensed.   Joylene Grapes, NP 10/06/2023, 1:54 PM [facility]

## 2023-10-06 NOTE — Telephone Encounter (Signed)
 Called patient to set up a Tele Visit for a pre-op clearance on 10/17/23 @ 10:20. Meds rec and consent done.

## 2023-10-06 NOTE — Telephone Encounter (Signed)
 Called patient to set up a Tele Visit for a pre-op clearance on 10/17/23 @ 10:20. Meds rec and consent done.       Patient Consent for Virtual Visit        Jade Bradley has provided verbal consent on 10/06/2023 for a virtual visit (video or telephone).   CONSENT FOR VIRTUAL VISIT FOR:  Jade Bradley  By participating in this virtual visit I agree to the following:  I hereby voluntarily request, consent and authorize [facility] and its employed or contracted physicians, physician assistants, nurse practitioners or other licensed health care professionals (the Practitioner), to provide me with telemedicine health care services (the "Services") as deemed necessary by the treating Practitioner. I acknowledge and consent to receive the Services by the Practitioner via telemedicine. I understand that the telemedicine visit will involve communicating with the Practitioner through live audiovisual communication technology and the disclosure of certain medical information by electronic transmission. I acknowledge that I have been given the opportunity to request an in-person assessment or other available alternative prior to the telemedicine visit and am voluntarily participating in the telemedicine visit.  I understand that I have the right to withhold or withdraw my consent to the use of telemedicine in the course of my care at any time, without affecting my right to future care or treatment, and that the Practitioner or I may terminate the telemedicine visit at any time. I understand that I have the right to inspect all information obtained and/or recorded in the course of the telemedicine visit and may receive copies of available information for a reasonable fee.  I understand that some of the potential risks of receiving the Services via telemedicine include:  Delay or interruption in medical evaluation due to technological equipment failure or disruption; Information transmitted  may not be sufficient (e.g. poor resolution of images) to allow for appropriate medical decision making by the Practitioner; and/or  In rare instances, security protocols could fail, causing a breach of personal health information.  Furthermore, I acknowledge that it is my responsibility to provide information about my medical history, conditions and care that is complete and accurate to the best of my ability. I acknowledge that Practitioner's advice, recommendations, and/or decision may be based on factors not within their control, such as incomplete or inaccurate data provided by me or distortions of diagnostic images or specimens that may result from electronic transmissions. I understand that the practice of medicine is not an exact science and that Practitioner makes no warranties or guarantees regarding treatment outcomes. I acknowledge that a copy of this consent can be made available to me via my patient portal [facility] MyChart), or I can request a printed copy by calling the office of [facility].    I understand that my insurance will be billed for this visit.   I have read or had this consent read to me. I understand the contents of this consent, which adequately explains the benefits and risks of the Services being provided via telemedicine.  I have been provided ample opportunity to ask questions regarding this consent and the Services and have had my questions answered to my satisfaction. I give my informed consent for the services to be provided through the use of telemedicine in my medical care

## 2023-10-16 NOTE — Progress Notes (Unsigned)
   Virtual Visit via Telephone Note   Because of Jade Bradley co-morbid illnesses, she is at least at moderate risk for complications without adequate follow up.  This format is felt to be most appropriate for this patient at this time.  Due to technical limitations with video connection Web designer), today's appointment will be conducted as an audio only telehealth visit, and Jade Bradley verbally agreed to proceed in this manner.   All issues noted in this document were discussed and addressed.  No physical exam could be performed with this format.  Evaluation Performed:  Preoperative cardiovascular risk assessment _____________   Date:  10/17/2023   Patient ID:  Jade Bradley, DOB Apr 08, 1961, MRN [id number] Patient Location:  Home Provider location:   Office  Primary Care Provider:  Assunta Found, MD Primary Cardiologist:  Nona Dell, MD  Chief Complaint / Patient Profile   63 y.o. y/o female with a h/o  HFmrEF (heart failure with mildly reduced ejection fraction)  Non-ischemic cardiomyopathy  LHC 12/03/19: no CAD  TTE 11/25/21: EF 40-45, mild MR, small PFO Permanent atrial fibrillation  Hyperlipidemia  OSA  who is pending shoulder surgery with Dr. Shelle Iron under general anesthesia and presents today for telephonic preoperative cardiovascular risk assessment. Our PharmD has reviewed her history and recommended per our office protocol to hold Xarelto for 3 days prior to his procedure.   History of Present Illness    Jade Bradley is a 63 y.o. female who presents via audio/video conferencing for a telehealth visit today.  Pt was last seen in cardiology clinic on 06/22/23 by Sharlene Dory, NP.  At that time Jade Bradley was doing well.  The patient is now pending procedure as outlined above. Since her last visit, she has done well without chest discomfort, significant shortness of breath, leg edema or syncope or significant weight change.  Physical Exam     Vital Signs:  Jade Bradley does not have vital signs available for review today.  Given telephonic nature of communication, physical exam is limited. AAOx3. NAD. Normal affect.  Speech and respirations are unlabored.  Accessory Clinical Findings    None  Assessment & Plan    Assessment & Plan Preoperative cardiovascular examination Jade Bradley's perioperative risk of a major cardiac event is 0.9% according to the Revised Cardiac Risk Index (RCRI).  Therefore, she is at low risk for perioperative complications.   Her functional capacity is good at 4.31 METs according to the Duke Activity Status Index (DASI). Recommendations: According to ACC/AHA guidelines, no further cardiovascular testing needed.  The patient may proceed to surgery at acceptable risk.   Antiplatelet and/or Anticoagulation Recommendations: Xarelto (Rivaroxaban) can be held for 3 days prior to surgery.  Please resume post op when felt to be safe.   Heart Failure Medication Recommendations: Empagliflozin (Jardiance) should be held for 3 days prior to receiving anesthesia and resumed post op.  The patient was advised that if she develops new symptoms prior to surgery to contact our office to arrange for a follow-up visit, and she verbalized understanding.  A copy of this note will be routed to requesting surgeon.  Time:   Today, I have spent 4 minutes with the patient with telehealth technology discussing medical history, symptoms, and management plan.     Tereso Newcomer, PA-C 10/17/2023, 9:55 AM

## 2023-10-17 ENCOUNTER — Ambulatory Visit: Payer: Managed Care, Other (non HMO) | Attending: Physician Assistant | Admitting: Physician Assistant

## 2023-10-17 DIAGNOSIS — Z01810 Encounter for preprocedural cardiovascular examination: Secondary | ICD-10-CM | POA: Diagnosis not present

## 2023-10-17 NOTE — Telephone Encounter (Signed)
 Notes faxed to surgeon.  Tereso Newcomer, PA-C  10/17/2023 9:57 AM

## 2023-10-25 ENCOUNTER — Other Ambulatory Visit: Payer: Self-pay | Admitting: Cardiology

## 2023-10-25 ENCOUNTER — Other Ambulatory Visit: Payer: Self-pay | Admitting: Nurse Practitioner

## 2023-12-03 ENCOUNTER — Other Ambulatory Visit: Payer: Self-pay | Admitting: Cardiology

## 2024-02-10 ENCOUNTER — Other Ambulatory Visit: Payer: Self-pay | Admitting: Nurse Practitioner

## 2024-02-11 ENCOUNTER — Other Ambulatory Visit: Payer: Self-pay | Admitting: Cardiology

## 2024-02-11 ENCOUNTER — Other Ambulatory Visit: Payer: Self-pay | Admitting: Nurse Practitioner

## 2024-02-11 DIAGNOSIS — I480 Paroxysmal atrial fibrillation: Secondary | ICD-10-CM

## 2024-02-13 NOTE — Telephone Encounter (Signed)
 Xarelto  20mg  refill request received. Pt is 63 years old, weight-104kg, Crea-0.82 on 11/02/23 via [employer] from [place], last seen by Glendia Ferrier on 10/17/23 via TeleVisit, Diagnosis-Afib, CrCl- 119.78 mL/min; Dose is appropriate based on dosing criteria. Will send in refill to requested pharmacy.

## 2024-03-13 ENCOUNTER — Telehealth: Payer: Self-pay | Admitting: Nurse Practitioner

## 2024-03-13 ENCOUNTER — Other Ambulatory Visit: Payer: Self-pay

## 2024-03-13 ENCOUNTER — Other Ambulatory Visit (HOSPITAL_COMMUNITY): Payer: Self-pay

## 2024-03-13 ENCOUNTER — Encounter (HOSPITAL_COMMUNITY): Payer: Self-pay

## 2024-03-13 MED ORDER — SACUBITRIL-VALSARTAN 97-103 MG PO TABS
1.0000 | ORAL_TABLET | Freq: Two times a day (BID) | ORAL | 0 refills | Status: DC
  Filled 2024-03-13: qty 180, 90d supply, fill #0

## 2024-03-13 NOTE — Telephone Encounter (Signed)
*  STAT* If patient is at the pharmacy, call can be transferred to refill team.   1. Which medications need to be refilled? (please list name of each medication and dose if known)   ENTRESTO  97-103 MG    2. Which pharmacy/location (including street and city if local pharmacy) is medication to be sent to? [id number] - [place], [place] - [address] Phone: [phone]  Fax: [phone]     3. Do they need a 30 day or 90 day supply? 90 Pt has been out for 4 days

## 2024-03-19 ENCOUNTER — Encounter (INDEPENDENT_AMBULATORY_CARE_PROVIDER_SITE_OTHER): Payer: Self-pay | Admitting: *Deleted

## 2024-04-22 ENCOUNTER — Ambulatory Visit: Attending: Cardiology | Admitting: Cardiology

## 2024-04-22 ENCOUNTER — Encounter: Payer: Self-pay | Admitting: Cardiology

## 2024-04-22 VITALS — BP 114/72 | HR 95 | Ht 68.0 in | Wt 239.8 lb

## 2024-04-22 DIAGNOSIS — E782 Mixed hyperlipidemia: Secondary | ICD-10-CM | POA: Diagnosis not present

## 2024-04-22 DIAGNOSIS — I480 Paroxysmal atrial fibrillation: Secondary | ICD-10-CM

## 2024-04-22 DIAGNOSIS — I5022 Chronic systolic (congestive) heart failure: Secondary | ICD-10-CM

## 2024-04-22 NOTE — Progress Notes (Signed)
    Cardiology Office Note  Date: 04/22/2024   ID: Jade Bradley, DOB 1960-12-13, MRN [id number]  History of Present Illness: Jade Bradley is a 63 y.o. female last seen in November 2024 by Ms. Miriam NP, I reviewed her note.  Our last visit was in October 2023.  She is here for a follow-up visit.  Reports stable NYHA class I-II dyspnea, no palpitations or exertional chest pain.  She is under a lot of stress, her husband is currently on hospice.  Medications reviewed.  She reports compliance with her cardiac medications.  No obvious fluid retention.  Her last echocardiogram was in 2023, we discussed getting an updated study.  I reviewed her interval lab work.  I reviewed her ECG today which shows rate controlled atrial fibrillation.  Physical Exam: VS:  BP 114/72   Pulse 95   Ht 5' 8 (1.727 m)   Wt 239 lb 12.8 oz (108.8 kg)   LMP 08/11/2011   SpO2 96%   BMI 36.46 kg/m , BMI Body mass index is 36.46 kg/m.  Wt Readings from Last 3 Encounters:  04/22/24 239 lb 12.8 oz (108.8 kg)  09/10/23 229 lb 4.5 oz (104 kg)  06/22/23 229 lb 12.8 oz (104.2 kg)    General: Patient appears comfortable at rest. HEENT: Conjunctiva and lids normal. Neck: Supple, no elevated JVP or carotid bruits. Lungs: Clear to auscultation, nonlabored breathing at rest. Cardiac: Irregularly irregular without significant murmur or gallop. Extremities: No pitting edema.  ECG:  An ECG dated 01/18/2023 was personally reviewed today and demonstrated:  Atrial fibrillation with PVC.  Labwork:    Component Value Date/Time   CHOL 139 06/27/2023 0813   TRIG 89 06/27/2023 0813   HDL 40 06/27/2023 0813   CHOLHDL 3.5 06/27/2023 0813   CHOLHDL 3.9 12/01/2019 1459   VLDL 23 12/01/2019 1459   LDLCALC 82 06/27/2023 0813  March 2025: Hemoglobin 15.6, platelets 205, BUN 12, creatinine 0.82, GFR 82, potassium 4.1, AST 15, ALT 9, cholesterol 163, triglycerides 132, HDL 44, LDL 96  Other Studies Reviewed Today:  No  interval cardiac testing for review today.  Assessment and Plan:  1.  HFmrEF with history of nonischemic cardiomyopathy.  LVEF 40 to 45% by echocardiogram in March 2023.  Clinically stable with NYHA class I-II dyspnea, no obvious fluid retention.  We will update her echocardiogram.  Continue Toprol  XL 50 mg daily, Entresto  97/103 mg twice daily, Aldactone  12.5 mg daily, and Jardiance  10 mg daily.  2.  Permanent atrial fibrillation with CHA2DS2-VASc score of 3.  She is on Xarelto  20 mg daily for stroke prophylaxis, asymptomatic in terms of palpitations.  3.  Primary hypertension.  Blood pressure well-controlled today.  No changes made to present regimen.  4.  Mixed hyperlipidemia.  LDL 96 in March.  Currently on Pravachol  40 mg daily.  Disposition:  Follow up 6 months.  Signed, Jayson JUDITHANN Sierras, M.D., F.A.C.C. [facility] at [facility]

## 2024-04-22 NOTE — Patient Instructions (Addendum)

## 2024-05-08 ENCOUNTER — Ambulatory Visit: Attending: Cardiology

## 2024-05-08 DIAGNOSIS — I5020 Unspecified systolic (congestive) heart failure: Secondary | ICD-10-CM | POA: Diagnosis not present

## 2024-05-10 ENCOUNTER — Ambulatory Visit: Payer: Self-pay | Admitting: Cardiology

## 2024-05-10 LAB — ECHOCARDIOGRAM COMPLETE
AR max vel: 3.5 cm2
AV Area VTI: 3.66 cm2
AV Area mean vel: 3.38 cm2
AV Mean grad: 1.7 mmHg
AV Peak grad: 3.3 mmHg
Ao pk vel: 0.91 m/s
Calc EF: 48.5 %
Est EF: 50
S' Lateral: 4.1 cm
Single Plane A2C EF: 42.9 %
Single Plane A4C EF: 55.4 %

## 2024-05-18 ENCOUNTER — Other Ambulatory Visit: Payer: Self-pay | Admitting: Nurse Practitioner

## 2024-05-20 ENCOUNTER — Other Ambulatory Visit: Payer: Self-pay | Admitting: Nurse Practitioner

## 2024-06-03 ENCOUNTER — Telehealth: Payer: Self-pay | Admitting: Nurse Practitioner

## 2024-06-03 MED ORDER — SACUBITRIL-VALSARTAN 97-103 MG PO TABS: 1.0000 | ORAL_TABLET | Freq: Two times a day (BID) | ORAL | 3 refills | Status: AC

## 2024-06-03 MED ORDER — SPIRONOLACTONE 25 MG PO TABS: 12.5000 mg | ORAL_TABLET | Freq: Every day | ORAL | 3 refills | Status: AC

## 2024-06-03 NOTE — Telephone Encounter (Signed)
*  STAT* If patient is at the pharmacy, call can be transferred to refill team.   1. Which medications need to be refilled? (please list name of each medication and dose if known)   sacubitril -valsartan  (ENTRESTO ) 97-103 MG  spironolactone  (ALDACTONE ) 25 MG tablet    2. Would you like to learn more about the convenience, safety, & potential cost savings by using the [facility]? No    3. Are you open to using the [facility] (Type [facility]) No   4. Which pharmacy/location (including street and city if local pharmacy) is medication to be sent to? [id number] - [place], [place] - [address]     5. Do they need a 30 day or 90 day supply? 90 day

## 2024-06-03 NOTE — Telephone Encounter (Signed)
 Sent in refill

## 2024-06-17 ENCOUNTER — Other Ambulatory Visit: Payer: Self-pay | Admitting: Cardiology

## 2024-06-17 DIAGNOSIS — I480 Paroxysmal atrial fibrillation: Secondary | ICD-10-CM

## 2024-06-18 NOTE — Telephone Encounter (Signed)
 Prescription refill request for Xarelto  received.  Indication:afib Last office visit:9/25 Weight:108.8  kg Age:63 Scr:0.82  3/25 CrCl:123.75  ml/min  Prescription refilled

## 2024-07-21 ENCOUNTER — Other Ambulatory Visit: Payer: Self-pay | Admitting: Cardiology

## 2024-09-02 ENCOUNTER — Other Ambulatory Visit: Payer: Self-pay | Admitting: Cardiology

## 2024-10-16 ENCOUNTER — Ambulatory Visit: Admitting: Cardiology
